# Patient Record
Sex: Female | Born: 1985 | Race: Black or African American | Hispanic: No | Marital: Married | Smoking: Never smoker
Health system: Southern US, Community
[De-identification: ages and names within clinical notes are randomized; demographics above are authoritative.]

## PROBLEM LIST (undated history)

## (undated) DIAGNOSIS — D573 Sickle-cell trait: Secondary | ICD-10-CM

## (undated) DIAGNOSIS — O24919 Unspecified diabetes mellitus in pregnancy, unspecified trimester: Secondary | ICD-10-CM

---

## 2013-10-31 ENCOUNTER — Encounter (HOSPITAL_COMMUNITY): Payer: Self-pay | Admitting: Emergency Medicine

## 2013-10-31 ENCOUNTER — Emergency Department (INDEPENDENT_AMBULATORY_CARE_PROVIDER_SITE_OTHER)
Admission: EM | Admit: 2013-10-31 | Discharge: 2013-10-31 | Disposition: A | Discharge status: Home / Self Care | Payer: PRIVATE HEALTH INSURANCE | Source: Home / Self Care | Attending: Family Medicine | Admitting: Family Medicine

## 2013-10-31 DIAGNOSIS — H8110 Benign paroxysmal vertigo, unspecified ear: Secondary | ICD-10-CM

## 2013-10-31 MED ORDER — FLUTICASONE PROPIONATE 50 MCG/ACT NA SUSP: 1.0000 | Freq: Two times a day (BID) | NASAL | Status: DC

## 2013-10-31 MED ORDER — MECLIZINE HCL 50 MG PO TABS: 50.0000 mg | ORAL_TABLET | Freq: Three times a day (TID) | ORAL | Status: DC | PRN

## 2013-10-31 NOTE — ED Provider Notes (Signed)
CSN: [id number]     Arrival date & time 10/31/13  1958 History   First MD Initiated Contact with Patient 10/31/13 2018     Chief Complaint  Patient presents with  . Dizziness   (Consider location/radiation/quality/duration/timing/severity/associated sxs/prior Treatment) Patient is a 28 y.o. female presenting with dizziness. The history is provided by the patient.  Dizziness Quality:  Room spinning Severity:  Mild Onset quality:  Gradual Duration:  5 days Timing:  Intermittent Progression:  Waxing and waning Chronicity:  New Context: head movement   Relieved by:  Change in position Worsened by:  Nothing tried Ineffective treatments:  None tried Associated symptoms: no headaches, no nausea, no palpitations, no tinnitus, no vision changes, no vomiting and no weakness     History reviewed. No pertinent past medical history. Past Surgical History  Procedure Laterality Date  . Cesarean section     No family history on file. History  Substance Use Topics  . Smoking status: Never Smoker   . Smokeless tobacco: Not on file  . Alcohol Use: No   OB History   Grav Para Term Preterm Abortions TAB SAB Ect Mult Living                 Review of Systems  Constitutional: Negative.   HENT: Positive for congestion, postnasal drip and rhinorrhea. Negative for tinnitus.   Cardiovascular: Negative for palpitations.  Gastrointestinal: Negative for nausea and vomiting.  Neurological: Positive for dizziness. Negative for facial asymmetry, weakness, numbness and headaches.    Allergies  Review of patient's allergies indicates no known allergies.  Home Medications   Current Outpatient Rx  Name  Route  Sig  Dispense  Refill  . fluticasone (FLONASE) 50 MCG/ACT nasal spray   Each Nare   Place 1 spray into both nostrils 2 (two) times daily.   1 g   2   . meclizine (ANTIVERT) 50 MG tablet   Oral   Take 1 tablet (50 mg total) by mouth 3 (three) times daily as needed.   30 tablet   0     BP 126/82  Pulse 82  Temp(Src) 98.9 F (37.2 C) (Oral)  Resp 18  SpO2 100%  LMP 10/18/2013 Physical Exam  Constitutional: She is oriented to person, place, and time. She appears well-developed and well-nourished.  HENT:  Right Ear: External ear normal.  Left Ear: External ear normal.  Nose: Mucosal edema and rhinorrhea present.  Mouth/Throat: Oropharynx is clear and moist.  Eyes: EOM are normal. Pupils are equal, round, and reactive to light.  Neck: Normal range of motion. Neck supple.  Cardiovascular: Normal heart sounds.   Lymphadenopathy:    She has no cervical adenopathy.  Neurological: She is alert and oriented to person, place, and time. She has normal strength. No cranial nerve deficit or sensory deficit. She displays a negative Romberg sign. Coordination normal.  Skin: Skin is warm and dry.    ED Course  Procedures (including critical care time) Labs Review Labs Reviewed - No data to display Imaging Review No results found.   MDM   1. BPV (benign positional vertigo)       Linna HoffJames D Jaquell Seddon, MD 10/31/13 2046

## 2013-10-31 NOTE — Discharge Instructions (Signed)
Drink lots of water, use medicine as prescribed, return if needed.

## 2013-10-31 NOTE — ED Notes (Signed)
Reports dizziness for 5 days, dizziness is intermittent.  Patient denies pain.  Denies nausea, denies vomiting, denies diarrhea.

## 2014-05-09 ENCOUNTER — Ambulatory Visit (INDEPENDENT_AMBULATORY_CARE_PROVIDER_SITE_OTHER): Payer: PRIVATE HEALTH INSURANCE | Admitting: Obstetrics & Gynecology

## 2014-05-09 ENCOUNTER — Encounter: Payer: Self-pay | Admitting: Obstetrics & Gynecology

## 2014-05-09 ENCOUNTER — Other Ambulatory Visit: Payer: Self-pay | Admitting: Obstetrics & Gynecology

## 2014-05-09 VITALS — BP 107/76 | HR 87 | Ht 62.0 in | Wt 200.0 lb

## 2014-05-09 DIAGNOSIS — Z118 Encounter for screening for other infectious and parasitic diseases: Secondary | ICD-10-CM

## 2014-05-09 DIAGNOSIS — Z23 Encounter for immunization: Secondary | ICD-10-CM

## 2014-05-09 DIAGNOSIS — Z124 Encounter for screening for malignant neoplasm of cervix: Secondary | ICD-10-CM

## 2014-05-09 DIAGNOSIS — O34219 Maternal care for unspecified type scar from previous cesarean delivery: Secondary | ICD-10-CM | POA: Insufficient documentation

## 2014-05-09 DIAGNOSIS — Z3482 Encounter for supervision of other normal pregnancy, second trimester: Secondary | ICD-10-CM

## 2014-05-09 DIAGNOSIS — Z113 Encounter for screening for infections with a predominantly sexual mode of transmission: Secondary | ICD-10-CM

## 2014-05-09 DIAGNOSIS — O3421 Maternal care for scar from previous cesarean delivery: Secondary | ICD-10-CM

## 2014-05-09 DIAGNOSIS — Z1151 Encounter for screening for human papillomavirus (HPV): Secondary | ICD-10-CM

## 2014-05-09 DIAGNOSIS — Z98891 History of uterine scar from previous surgery: Secondary | ICD-10-CM | POA: Insufficient documentation

## 2014-05-09 MED ORDER — METRONIDAZOLE 500 MG PO TABS: 500.0000 mg | ORAL_TABLET | Freq: Two times a day (BID) | ORAL | Status: DC

## 2014-05-09 NOTE — Progress Notes (Signed)
   Subjective:    Brittany Livingston is a G2P1001 [id number] being seen today for her first obstetrical visit.  Her obstetrical history is significant for obesity and h/o C/S in [place] for "fetal distress". Patient does intend to breast feed. Pregnancy history fully reviewed.  Patient reports vaginal irritation.  Filed Vitals:   05/09/14 0935 05/09/14 0937  BP: 107/76   Pulse: 87   Height:  5\' 2"  (1.575 m)  Weight: 200 lb (90.719 kg)     HISTORY: OB History  Gravida Para Term Preterm AB SAB TAB Ectopic Multiple Living  2 1 1  0 0 0 0 0 0 1    # Outcome Date GA Lbr Len/2nd Weight Sex Delivery Anes PTL Lv  2 CUR           1 TRM 04/27/13   7 lb 4.4 oz (3.3 kg) M LTCS Spinal N Y     History reviewed. No pertinent past medical history. Past Surgical History  Procedure Laterality Date  . Cesarean section     History reviewed. No pertinent family history.   Exam    Uterus:     Pelvic Exam:    Perineum: No Hemorrhoids   Vulva: normal   Vagina:  Discharge c/w BV   pH:    Cervix: anteverted   Adnexa: normal adnexa   Bony Pelvis: android  System: Breast:  normal appearance, no masses or tenderness   Skin: normal coloration and turgor, no rashes    Neurologic: oriented   Extremities: normal strength, tone, and muscle mass   HEENT PERRLA   Mouth/Teeth mucous membranes moist, pharynx normal without lesions   Neck supple   Cardiovascular: regular rate and rhythm   Respiratory:  appears well, vitals normal, no respiratory distress, acyanotic, normal RR, ear and throat exam is normal, neck free of mass or lymphadenopathy, chest clear, no wheezing, crepitations, rhonchi, normal symmetric air entry   Abdomen: soft, non-tender; bowel sounds normal; no masses,  no organomegaly   Urinary: urethral meatus normal      Assessment:    Pregnancy: G2P1001 Patient Active Problem List   Diagnosis Date Noted  . Encounter for supervision of other normal pregnancy in second trimester 05/09/2014         Plan:     Initial labs drawn. Prenatal vitamins. Problem list reviewed and updated. Genetic Screening discussed Quad Screen: undecided.  Ultrasound discussed; fetal survey: requested.  Follow up in 4 weeks. I will treat her BV signs and symptoms with flagyl. She will get an early glucola at her next visit when she will need to decide about the Quad screen.  Karthikeya Funke C. 05/09/2014

## 2014-05-09 NOTE — Progress Notes (Signed)
New OB is having increased discharge.  Bedside scan shows HC and Femur length to be 14 wk 5 days and 14 weeks 3 days with is consistent with LMP dating within limits.

## 2014-05-10 LAB — OBSTETRIC PANEL
Antibody Screen: NEGATIVE
Basophils Absolute: 0 10*3/uL (ref 0.0–0.1)
Basophils Relative: 0 % (ref 0–1)
EOS ABS: 0.1 10*3/uL (ref 0.0–0.7)
Eosinophils Relative: 1 % (ref 0–5)
HCT: 39.1 % (ref 36.0–46.0)
Hemoglobin: 12.7 g/dL (ref 12.0–15.0)
Hepatitis B Surface Ag: NEGATIVE
LYMPHS ABS: 2.1 10*3/uL (ref 0.7–4.0)
LYMPHS PCT: 31 % (ref 12–46)
MCH: 25.3 pg — AB (ref 26.0–34.0)
MCHC: 32.5 g/dL (ref 30.0–36.0)
MCV: 77.9 fL — AB (ref 78.0–100.0)
MONO ABS: 0.4 10*3/uL (ref 0.1–1.0)
Monocytes Relative: 6 % (ref 3–12)
Neutro Abs: 4.3 10*3/uL (ref 1.7–7.7)
Neutrophils Relative %: 62 % (ref 43–77)
PLATELETS: 243 10*3/uL (ref 150–400)
RBC: 5.02 MIL/uL (ref 3.87–5.11)
RDW: 15.8 % — ABNORMAL HIGH (ref 11.5–15.5)
Rh Type: POSITIVE
Rubella: 28.4 Index — ABNORMAL HIGH (ref <0.90)
WBC: 6.9 10*3/uL (ref 4.0–10.5)

## 2014-05-10 LAB — HIV ANTIBODY (ROUTINE TESTING W REFLEX): HIV 1&2 Ab, 4th Generation: NONREACTIVE

## 2014-05-10 LAB — SICKLE CELL SCREEN: Sickle Cell Screen: POSITIVE — AB

## 2014-05-11 LAB — CULTURE, OB URINE
COLONY COUNT: NO GROWTH
Organism ID, Bacteria: NO GROWTH

## 2014-05-12 LAB — CYTOLOGY - PAP

## 2014-05-13 ENCOUNTER — Telehealth: Payer: Self-pay | Admitting: *Deleted

## 2014-05-13 DIAGNOSIS — Z3481 Encounter for supervision of other normal pregnancy, first trimester: Secondary | ICD-10-CM

## 2014-05-13 NOTE — Telephone Encounter (Signed)
Contacted [facility] to add hgm electrophoresis testing to patients positive sickle cell screen.

## 2014-05-13 NOTE — Telephone Encounter (Signed)
Message copied by Barbara CowerNOGUES, Harper Vandervoort L on Tue May 13, 2014 11:21 AM ------      Message from: Allie BossierVE, MYRA C      Created: Mon May 12, 2014  4:22 PM       She had a+ sickle cell screen. Can you please run the confirmatory electrophoresis?      Thanks ------

## 2014-05-15 LAB — HEMOGLOBINOPATHY EVALUATION
HEMOGLOBIN OTHER: 0 %
HGB S QUANTITAION: 35 % — AB
Hgb A2 Quant: 3.4 % — ABNORMAL HIGH (ref 2.2–3.2)
Hgb A: 61 % — ABNORMAL LOW (ref 96.8–97.8)
Hgb F Quant: 0.6 % (ref 0.0–2.0)

## 2014-06-02 ENCOUNTER — Encounter: Payer: Self-pay | Admitting: Obstetrics & Gynecology

## 2014-06-06 ENCOUNTER — Ambulatory Visit (INDEPENDENT_AMBULATORY_CARE_PROVIDER_SITE_OTHER): Payer: PRIVATE HEALTH INSURANCE | Admitting: Obstetrics & Gynecology

## 2014-06-06 VITALS — BP 117/74 | HR 83 | Wt 203.0 lb

## 2014-06-06 DIAGNOSIS — N898 Other specified noninflammatory disorders of vagina: Secondary | ICD-10-CM

## 2014-06-06 DIAGNOSIS — O26892 Other specified pregnancy related conditions, second trimester: Secondary | ICD-10-CM

## 2014-06-06 DIAGNOSIS — Z3482 Encounter for supervision of other normal pregnancy, second trimester: Secondary | ICD-10-CM

## 2014-06-06 NOTE — Progress Notes (Signed)
Routine visit. Good FM. She complains of a month of burning sensation that occurs in her vagina after sex. She and her husband are not using any creams, gels, etc. She took her flagyl prescribed at her last visit for BV. She denies vaginal dryness. She denies any pregnancy-related problems. She declines Quad screen. She agrees to a MSAFP. I will schedule her anatomy u/s for 20 weeks. With speculum exam her vulva appears normal. Her vagina has relatively large amount of white discharge. I sent a wet prep.

## 2014-06-06 NOTE — Progress Notes (Signed)
C/o Burning sensation during intercourse only.

## 2014-06-07 LAB — WET PREP, GENITAL
Clue Cells Wet Prep HPF POC: NONE SEEN
Trich, Wet Prep: NONE SEEN
WBC, Wet Prep HPF POC: NONE SEEN
Yeast Wet Prep HPF POC: NONE SEEN

## 2014-06-10 ENCOUNTER — Other Ambulatory Visit (INDEPENDENT_AMBULATORY_CARE_PROVIDER_SITE_OTHER): Payer: PRIVATE HEALTH INSURANCE | Admitting: *Deleted

## 2014-06-10 DIAGNOSIS — O99212 Obesity complicating pregnancy, second trimester: Secondary | ICD-10-CM

## 2014-06-10 DIAGNOSIS — E669 Obesity, unspecified: Secondary | ICD-10-CM

## 2014-06-10 NOTE — Progress Notes (Signed)
Pt here today for a early 1 hr GTT.

## 2014-06-11 LAB — ALPHA FETOPROTEIN, MATERNAL
AFP: 31.9 ng/mL
CURR GEST AGE: 18 wks.days
MoM for AFP: 0.78
OPEN SPINA BIFIDA: NEGATIVE

## 2014-06-11 LAB — GLUCOSE TOLERANCE, 1 HOUR (50G) W/O FASTING: GLUCOSE 1 HOUR GTT: 103 mg/dL (ref 70–140)

## 2014-06-20 ENCOUNTER — Ambulatory Visit (HOSPITAL_COMMUNITY)
Admission: RE | Admit: 2014-06-20 | Discharge: 2014-06-20 | Disposition: A | Discharge status: Home / Self Care | Payer: PRIVATE HEALTH INSURANCE | Source: Ambulatory Visit | Attending: Obstetrics & Gynecology | Admitting: Obstetrics & Gynecology

## 2014-06-20 DIAGNOSIS — Z3A20 20 weeks gestation of pregnancy: Secondary | ICD-10-CM | POA: Insufficient documentation

## 2014-06-20 DIAGNOSIS — Z3482 Encounter for supervision of other normal pregnancy, second trimester: Secondary | ICD-10-CM

## 2014-06-20 DIAGNOSIS — Z36 Encounter for antenatal screening of mother: Secondary | ICD-10-CM | POA: Insufficient documentation

## 2014-06-20 DIAGNOSIS — Z6837 Body mass index (BMI) 37.0-37.9, adult: Secondary | ICD-10-CM | POA: Insufficient documentation

## 2014-06-20 DIAGNOSIS — O3421 Maternal care for scar from previous cesarean delivery: Secondary | ICD-10-CM | POA: Insufficient documentation

## 2014-06-20 DIAGNOSIS — O99212 Obesity complicating pregnancy, second trimester: Secondary | ICD-10-CM | POA: Insufficient documentation

## 2014-06-20 DIAGNOSIS — Z3689 Encounter for other specified antenatal screening: Secondary | ICD-10-CM | POA: Insufficient documentation

## 2014-06-20 DIAGNOSIS — O99210 Obesity complicating pregnancy, unspecified trimester: Secondary | ICD-10-CM | POA: Insufficient documentation

## 2014-07-04 ENCOUNTER — Ambulatory Visit (INDEPENDENT_AMBULATORY_CARE_PROVIDER_SITE_OTHER): Payer: PRIVATE HEALTH INSURANCE | Admitting: Family Medicine

## 2014-07-04 VITALS — BP 110/75 | HR 90 | Wt 207.0 lb

## 2014-07-04 DIAGNOSIS — Z3482 Encounter for supervision of other normal pregnancy, second trimester: Secondary | ICD-10-CM

## 2014-07-04 DIAGNOSIS — O3421 Maternal care for scar from previous cesarean delivery: Secondary | ICD-10-CM

## 2014-07-04 NOTE — Patient Instructions (Addendum)
Second Trimester of Pregnancy The second trimester is from week 13 through week 28, months 4 through 6. The second trimester is often a time when you feel your best. Your body has also adjusted to being pregnant, and you begin to feel better physically. Usually, morning sickness has lessened or quit completely, you may have more energy, and you may have an increase in appetite. The second trimester is also a time when the fetus is growing rapidly. At the end of the sixth month, the fetus is about 9 inches long and weighs about 1 pounds. You will likely begin to feel the baby move (quickening) between 18 and 20 weeks of the pregnancy. BODY CHANGES Your body goes through many changes during pregnancy. The changes vary from woman to woman.   Your weight will continue to increase. You will notice your lower abdomen bulging out.  You may begin to get stretch marks on your hips, abdomen, and breasts.  You may develop headaches that can be relieved by medicines approved by your health care provider.  You may urinate more often because the fetus is pressing on your bladder.  You may develop or continue to have heartburn as a result of your pregnancy.  You may develop constipation because certain hormones are causing the muscles that push waste through your intestines to slow down.  You may develop hemorrhoids or swollen, bulging veins (varicose veins).  You may have back pain because of the weight gain and pregnancy hormones relaxing your joints between the bones in your pelvis and as a result of a shift in weight and the muscles that support your balance.  Your breasts will continue to grow and be tender.  Your gums may bleed and may be sensitive to brushing and flossing.  Dark spots or blotches (chloasma, mask of pregnancy) may develop on your face. This will likely fade after the baby is born.  A dark line from your belly button to the pubic area (linea nigra) may appear. This will likely  fade after the baby is born.  You may have changes in your hair. These can include thickening of your hair, rapid growth, and changes in texture. Some women also have hair loss during or after pregnancy, or hair that feels dry or thin. Your hair will most likely return to normal after your baby is born. WHAT TO EXPECT AT YOUR PRENATAL VISITS During a routine prenatal visit:  You will be weighed to make sure you and the fetus are growing normally.  Your blood pressure will be taken.  Your abdomen will be measured to track your baby's growth.  The fetal heartbeat will be listened to.  Any test results from the previous visit will be discussed. Your health care provider may ask you:  How you are feeling.  If you are feeling the baby move.  If you have had any abnormal symptoms, such as leaking fluid, bleeding, severe headaches, or abdominal cramping.  If you have any questions. Other tests that may be performed during your second trimester include:  Blood tests that check for:  Low iron levels (anemia).  Gestational diabetes (between 24 and 28 weeks).  Rh antibodies.  Urine tests to check for infections, diabetes, or protein in the urine.  An ultrasound to confirm the proper growth and development of the baby.  An amniocentesis to check for possible genetic problems.  Fetal screens for spina bifida and Down syndrome. HOME CARE INSTRUCTIONS   Avoid all smoking, herbs, alcohol, and unprescribed   drugs. These chemicals affect the formation and growth of the baby.  Follow your health care provider's instructions regarding medicine use. There are medicines that are either safe or unsafe to take during pregnancy.  Exercise only as directed by your health care provider. Experiencing uterine cramps is a good sign to stop exercising.  Continue to eat regular, healthy meals.  Wear a good support bra for breast tenderness.  Do not use hot tubs, steam rooms, or saunas.  Wear  your seat belt at all times when driving.  Avoid raw meat, uncooked cheese, cat litter boxes, and soil used by cats. These carry germs that can cause birth defects in the baby.  Take your prenatal vitamins.  Try taking a stool softener (if your health care provider approves) if you develop constipation. Eat more high-fiber foods, such as fresh vegetables or fruit and whole grains. Drink plenty of fluids to keep your urine clear or pale yellow.  Take warm sitz baths to soothe any pain or discomfort caused by hemorrhoids. Use hemorrhoid cream if your health care provider approves.  If you develop varicose veins, wear support hose. Elevate your feet for 15 minutes, 3-4 times a day. Limit salt in your diet.  Avoid heavy lifting, wear low heel shoes, and practice good posture.  Rest with your legs elevated if you have leg cramps or low back pain.  Visit your dentist if you have not gone yet during your pregnancy. Use a soft toothbrush to brush your teeth and be gentle when you floss.  A sexual relationship may be continued unless your health care provider directs you otherwise.  Continue to go to all your prenatal visits as directed by your health care provider. SEEK MEDICAL CARE IF:   You have dizziness.  You have mild pelvic cramps, pelvic pressure, or nagging pain in the abdominal area.  You have persistent nausea, vomiting, or diarrhea.  You have a bad smelling vaginal discharge.  You have pain with urination. SEEK IMMEDIATE MEDICAL CARE IF:   You have a fever.  You are leaking fluid from your vagina.  You have spotting or bleeding from your vagina.  You have severe abdominal cramping or pain.  You have rapid weight gain or loss.  You have shortness of breath with chest pain.  You notice sudden or extreme swelling of your face, hands, ankles, feet, or legs.  You have not felt your baby move in over an hour.  You have severe headaches that do not go away with  medicine.  You have vision changes. Document Released: 07/12/2001 Document Revised: 07/23/2013 Document Reviewed: 09/18/2012 [employer] Patient Information 2015 [employer]. This information is not intended to replace advice given to you by your health care provider. Make sure you discuss any questions you have with your health care provider.  Breastfeeding Deciding to breastfeed is one of the best choices you can make for you and your baby. A change in hormones during pregnancy causes your breast tissue to grow and increases the number and size of your milk ducts. These hormones also allow proteins, sugars, and fats from your blood supply to make breast milk in your milk-producing glands. Hormones prevent breast milk from being released before your baby is born as well as prompt milk flow after birth. Once breastfeeding has begun, thoughts of your baby, as well as his or her sucking or crying, can stimulate the release of milk from your milk-producing glands.  BENEFITS OF BREASTFEEDING For Your Baby  Your first   milk (colostrum) helps your baby's digestive system function better.   There are antibodies in your milk that help your baby fight off infections.   Your baby has a lower incidence of asthma, allergies, and sudden infant death syndrome.   The nutrients in breast milk are better for your baby than infant formulas and are designed uniquely for your baby's needs.   Breast milk improves your baby's brain development.   Your baby is less likely to develop other conditions, such as childhood obesity, asthma, or type 2 diabetes mellitus.  For You   Breastfeeding helps to create a very special bond between you and your baby.   Breastfeeding is convenient. Breast milk is always available at the correct temperature and costs nothing.   Breastfeeding helps to burn calories and helps you lose the weight gained during pregnancy.   Breastfeeding makes your uterus contract to its  prepregnancy size faster and slows bleeding (lochia) after you give birth.   Breastfeeding helps to lower your risk of developing type 2 diabetes mellitus, osteoporosis, and breast or ovarian cancer later in life. SIGNS THAT YOUR BABY IS HUNGRY Early Signs of Hunger  Increased alertness or activity.  Stretching.  Movement of the head from side to side.  Movement of the head and opening of the mouth when the corner of the mouth or cheek is stroked (rooting).  Increased sucking sounds, smacking lips, cooing, sighing, or squeaking.  Hand-to-mouth movements.  Increased sucking of fingers or hands. Late Signs of Hunger  Fussing.  Intermittent crying. Extreme Signs of Hunger Signs of extreme hunger will require calming and consoling before your baby will be able to breastfeed successfully. Do not wait for the following signs of extreme hunger to occur before you initiate breastfeeding:   Restlessness.  A loud, strong cry.   Screaming. BREASTFEEDING BASICS Breastfeeding Initiation  Find a comfortable place to sit or lie down, with your neck and back well supported.  Place a pillow or rolled up blanket under your baby to bring him or her to the level of your breast (if you are seated). Nursing pillows are specially designed to help support your arms and your baby while you breastfeed.  Make sure that your baby's abdomen is facing your abdomen.   Gently massage your breast. With your fingertips, massage from your chest wall toward your nipple in a circular motion. This encourages milk flow. You may need to continue this action during the feeding if your milk flows slowly.  Support your breast with 4 fingers underneath and your thumb above your nipple. Make sure your fingers are well away from your nipple and your baby's mouth.   Stroke your baby's lips gently with your finger or nipple.   When your baby's mouth is open wide enough, quickly bring your baby to your breast,  placing your entire nipple and as much of the colored area around your nipple (areola) as possible into your baby's mouth.   More areola should be visible above your baby's upper lip than below the lower lip.   Your baby's tongue should be between his or her lower gum and your breast.   Ensure that your baby's mouth is correctly positioned around your nipple (latched). Your baby's lips should create a seal on your breast and be turned out (everted).  It is common for your baby to suck about 2-3 minutes in order to start the flow of breast milk. Latching Teaching your baby how to latch on to your breast   properly is very important. An improper latch can cause nipple pain and decreased milk supply for you and poor weight gain in your baby. Also, if your baby is not latched onto your nipple properly, he or she may swallow some air during feeding. This can make your baby fussy. Burping your baby when you switch breasts during the feeding can help to get rid of the air. However, teaching your baby to latch on properly is still the best way to prevent fussiness from swallowing air while breastfeeding. Signs that your baby has successfully latched on to your nipple:    Silent tugging or silent sucking, without causing you pain.   Swallowing heard between every 3-4 sucks.    Muscle movement above and in front of his or her ears while sucking.  Signs that your baby has not successfully latched on to nipple:   Sucking sounds or smacking sounds from your baby while breastfeeding.  Nipple pain. If you think your baby has not latched on correctly, slip your finger into the corner of your baby's mouth to break the suction and place it between your baby's gums. Attempt breastfeeding initiation again. Signs of Successful Breastfeeding Signs from your baby:   A gradual decrease in the number of sucks or complete cessation of sucking.   Falling asleep.   Relaxation of his or her body.    Retention of a small amount of milk in his or her mouth.   Letting go of your breast by himself or herself. Signs from you:  Breasts that have increased in firmness, weight, and size 1-3 hours after feeding.   Breasts that are softer immediately after breastfeeding.  Increased milk volume, as well as a change in milk consistency and color by the fifth day of breastfeeding.   Nipples that are not sore, cracked, or bleeding. Signs That Your Baby is Getting Enough Milk  Wetting at least 3 diapers in a 24-hour period. The urine should be clear and pale yellow by age 5 days.  At least 3 stools in a 24-hour period by age 5 days. The stool should be soft and yellow.  At least 3 stools in a 24-hour period by age 7 days. The stool should be seedy and yellow.  No loss of weight greater than 10% of birth weight during the first 3 days of age.  Average weight gain of 4-7 ounces (113-198 g) per week after age 4 days.  Consistent daily weight gain by age 5 days, without weight loss after the age of 2 weeks. After a feeding, your baby may spit up a small amount. This is common. BREASTFEEDING FREQUENCY AND DURATION Frequent feeding will help you make more milk and can prevent sore nipples and breast engorgement. Breastfeed when you feel the need to reduce the fullness of your breasts or when your baby shows signs of hunger. This is called "breastfeeding on demand." Avoid introducing a pacifier to your baby while you are working to establish breastfeeding (the first 4-6 weeks after your baby is born). After this time you may choose to use a pacifier. Research has shown that pacifier use during the first year of a baby's life decreases the risk of sudden infant death syndrome (SIDS). Allow your baby to feed on each breast as long as he or she wants. Breastfeed until your baby is finished feeding. When your baby unlatches or falls asleep while feeding from the first breast, offer the second breast.  Because newborns are often sleepy in the   first few weeks of life, you may need to awaken your baby to get him or her to feed. Breastfeeding times will vary from baby to baby. However, the following rules can serve as a guide to help you ensure that your baby is properly fed:  Newborns (babies 4 weeks of age or younger) may breastfeed every 1-3 hours.  Newborns should not go longer than 3 hours during the day or 5 hours during the night without breastfeeding.  You should breastfeed your baby a minimum of 8 times in a 24-hour period until you begin to introduce solid foods to your baby at around 6 months of age. BREAST MILK PUMPING Pumping and storing breast milk allows you to ensure that your baby is exclusively fed your breast milk, even at times when you are unable to breastfeed. This is especially important if you are going back to work while you are still breastfeeding or when you are not able to be present during feedings. Your lactation consultant can give you guidelines on how long it is safe to store breast milk.  A breast pump is a machine that allows you to pump milk from your breast into a sterile bottle. The pumped breast milk can then be stored in a refrigerator or freezer. Some breast pumps are operated by hand, while others use electricity. Ask your lactation consultant which type will work best for you. Breast pumps can be purchased, but some hospitals and breastfeeding support groups lease breast pumps on a monthly basis. A lactation consultant can teach you how to hand express breast milk, if you prefer not to use a pump.  CARING FOR YOUR BREASTS WHILE YOU BREASTFEED Nipples can become dry, cracked, and sore while breastfeeding. The following recommendations can help keep your breasts moisturized and healthy:  Avoid using soap on your nipples.   Wear a supportive bra. Although not required, special nursing bras and tank tops are designed to allow access to your breasts for  breastfeeding without taking off your entire bra or top. Avoid wearing underwire-style bras or extremely tight bras.  Air dry your nipples for 3-4minutes after each feeding.   Use only cotton bra pads to absorb leaked breast milk. Leaking of breast milk between feedings is normal.   Use lanolin on your nipples after breastfeeding. Lanolin helps to maintain your skin's normal moisture barrier. If you use pure lanolin, you do not need to wash it off before feeding your baby again. Pure lanolin is not toxic to your baby. You may also hand express a few drops of breast milk and gently massage that milk into your nipples and allow the milk to air dry. In the first few weeks after giving birth, some women experience extremely full breasts (engorgement). Engorgement can make your breasts feel heavy, warm, and tender to the touch. Engorgement peaks within 3-5 days after you give birth. The following recommendations can help ease engorgement:  Completely empty your breasts while breastfeeding or pumping. You may want to start by applying warm, moist heat (in the shower or with warm water-soaked hand towels) just before feeding or pumping. This increases circulation and helps the milk flow. If your baby does not completely empty your breasts while breastfeeding, pump any extra milk after he or she is finished.  Wear a snug bra (nursing or regular) or tank top for 1-2 days to signal your body to slightly decrease milk production.  Apply ice packs to your breasts, unless this is too uncomfortable for you.    Make sure that your baby is latched on and positioned properly while breastfeeding. If engorgement persists after 48 hours of following these recommendations, contact your health care provider or a lactation consultant. OVERALL HEALTH CARE RECOMMENDATIONS WHILE BREASTFEEDING  Eat healthy foods. Alternate between meals and snacks, eating 3 of each per day. Because what you eat affects your breast milk,  some of the foods may make your baby more irritable than usual. Avoid eating these foods if you are sure that they are negatively affecting your baby.  Drink milk, fruit juice, and water to satisfy your thirst (about 10 glasses a day).   Rest often, relax, and continue to take your prenatal vitamins to prevent fatigue, stress, and anemia.  Continue breast self-awareness checks.  Avoid chewing and smoking tobacco.  Avoid alcohol and drug use. Some medicines that may be harmful to your baby can pass through breast milk. It is important to ask your health care provider before taking any medicine, including all over-the-counter and prescription medicine as well as vitamin and herbal supplements. It is possible to become pregnant while breastfeeding. If birth control is desired, ask your health care provider about options that will be safe for your baby. SEEK MEDICAL CARE IF:   You feel like you want to stop breastfeeding or have become frustrated with breastfeeding.  You have painful breasts or nipples.  Your nipples are cracked or bleeding.  Your breasts are red, tender, or warm.  You have a swollen area on either breast.  You have a fever or chills.  You have nausea or vomiting.  You have drainage other than breast milk from your nipples.  Your breasts do not become full before feedings by the fifth day after you give birth.  You feel sad and depressed.  Your baby is too sleepy to eat well.  Your baby is having trouble sleeping.   Your baby is wetting less than 3 diapers in a 24-hour period.  Your baby has less than 3 stools in a 24-hour period.  Your baby's skin or the white part of his or her eyes becomes yellow.   Your baby is not gaining weight by 5 days of age. SEEK IMMEDIATE MEDICAL CARE IF:   Your baby is overly tired (lethargic) and does not want to wake up and feed.  Your baby develops an unexplained fever. Document Released: 07/18/2005 Document Revised:  07/23/2013 Document Reviewed: 01/09/2013 [employer] Patient Information 2015 [employer]. This information is not intended to replace advice given to you by your health care provider. Make sure you discuss any questions you have with your health care provider.  Vaginal Birth After Cesarean Delivery Vaginal birth after cesarean delivery (VBAC) is giving birth vaginally after previously delivering a baby by a cesarean. In the past, if a woman had a cesarean delivery, all births afterward would be done by cesarean delivery. This is no longer true. It can be safe for the mother to try a vaginal delivery after having a cesarean delivery.  It is important to discuss VBAC with your health care provider early in the pregnancy so you can understand the risks, benefits, and options. It will give you time to decide what is best in your particular case. The final decision about whether to have a VBAC or repeat cesarean delivery should be between you and your health care provider. Any changes in your health or your baby's health during your pregnancy may make it necessary to change your initial decision about VBAC.  WOMEN   WHO PLAN TO HAVE A VBAC SHOULD CHECK WITH THEIR HEALTH CARE PROVIDER TO BE SURE THAT:  The previous cesarean delivery was done with a low transverse uterine cut (incision) (not a vertical classical incision).   The birth canal is big enough for the baby.   There were no other operations on the uterus.   An electronic fetal monitor (EFM) will be on at all times during labor.   An operating room will be available and ready in case an emergency cesarean delivery is needed.   A health care provider and surgical nursing staff will be available at all times during labor to be ready to do an emergency delivery cesarean if necessary.   An anesthesiologist will be present in case an emergency cesarean delivery is needed.   The nursery is prepared and has adequate personnel and necessary  equipment available to care for the baby in case of an emergency cesarean delivery. BENEFITS OF VBAC  Shorter stay in the hospital.   Avoidance of risks associated with cesarean delivery, such as:  Surgical complications, such as opening of the incision or hernia in the incision.  Injury to other organs.  Fever. This can occur if an infection develops after surgery. It can also occur as a reaction to the medicine given to make you numb during the surgery.  Less blood loss and need for blood transfusions.  Lower risk of blood clots and infection.  Shorter recovery.   Decreased risk for having to remove the uterus (hysterectomy).   Decreased risk for the placenta to completely or partially cover the opening of the uterus (placenta previa) with a future pregnancy.   Decrease risk in future labor and delivery. RISKS OF A VBAC  Tearing (rupture) of the uterus. This is occurs in less than 1% of VBACs. The risk of this happening is higher if:  Steps are taken to begin the labor process (induce labor) or stimulate or strengthen contractions (augment labor).   Medicine is used to soften (ripen) the cervix.  Having to remove the uterus (hysterectomy) if it ruptures. VBAC SHOULD NOT BE DONE IF:  The previous cesarean delivery was done with a vertical (classical) or T-shaped incision or you do not know what kind of incision was made.   You had a ruptured uterus.   You have had certain types of surgery on your uterus, such as removal of uterine fibroids. Ask your health care provider about other types of surgeries that prevent you from having a VBAC.  You have certain medical or childbirth (obstetrical) problems.   There are problems with the baby.   You have had two previous cesarean deliveries and no vaginal deliveries. OTHER FACTS TO KNOW ABOUT VBAC:  It is safe to have an epidural anesthetic with VBAC.   It is safe to turn the baby from a breech position (attempt  an external cephalic version).   It is safe to try a VBAC with twins.   VBAC may not be successful if your baby weights 8.8 lb (4 kg) or more. However, weight predictions are not always accurate and should not be used alone to decide if VBAC is right for you.  There is an increased failure rate if the time between the cesarean delivery and VBAC is less than 19 months.   Your health care provider may advise against a VBAC if you have preeclampsia (high blood pressure, protein in the urine, and swelling of face and extremities).   VBAC is often successful   if you previously gave birth vaginally.   VBAC is often successful when the labor starts spontaneously before the due date.   Delivering a baby through a VBAC is similar to having a normal spontaneous vaginal delivery. Document Released: 01/08/2007 Document Revised: 12/02/2013 Document Reviewed: 02/14/2013 [employer] Patient Information 2015 [employer]. This information is not intended to replace advice given to you by your health care provider. Make sure you discuss any questions you have with your health care provider.  

## 2014-07-07 NOTE — Progress Notes (Signed)
Doing well Routine precautions

## 2014-08-01 DIAGNOSIS — O24919 Unspecified diabetes mellitus in pregnancy, unspecified trimester: Secondary | ICD-10-CM

## 2014-08-01 HISTORY — DX: Unspecified diabetes mellitus in pregnancy, unspecified trimester: O24.919

## 2014-08-01 NOTE — L&D Delivery Note (Signed)
Elective repeat c/section regional anesthesia.  No complications of delivery, Apgars 7/9, routine suctioning warming drying. EXAM GEN: normal vigour, acrocyanosis HEENT: NCAT af soft CHEST: clear CV: nl heart tones, no murmur, pulses & cap refill WNL GU: nl female NEURO: tone, reflexes, strength WNL  Assess: normal newborn PLAN: routine skin-skin care

## 2014-08-12 ENCOUNTER — Ambulatory Visit (INDEPENDENT_AMBULATORY_CARE_PROVIDER_SITE_OTHER): Payer: PRIVATE HEALTH INSURANCE | Admitting: Family Medicine

## 2014-08-12 VITALS — BP 125/76 | HR 99 | Wt 212.0 lb

## 2014-08-12 DIAGNOSIS — Z3482 Encounter for supervision of other normal pregnancy, second trimester: Secondary | ICD-10-CM

## 2014-08-12 DIAGNOSIS — Z3493 Encounter for supervision of normal pregnancy, unspecified, third trimester: Secondary | ICD-10-CM

## 2014-08-12 DIAGNOSIS — Z23 Encounter for immunization: Secondary | ICD-10-CM

## 2014-08-12 NOTE — Progress Notes (Signed)
Leaning toward R[facility] Good FM. TDaP today-- 28 wk labs next week.

## 2014-08-12 NOTE — Patient Instructions (Signed)
Third Trimester of Pregnancy The third trimester is from week 29 through week 42, months 7 through 9. The third trimester is a time when the fetus is growing rapidly. At the end of the ninth month, the fetus is about 20 inches in length and weighs 6-10 pounds.  BODY CHANGES Your body goes through many changes during pregnancy. The changes vary from woman to woman.   Your weight will continue to increase. You can expect to gain 25-35 pounds (11-16 kg) by the end of the pregnancy.  You may begin to get stretch marks on your hips, abdomen, and breasts.  You may urinate more often because the fetus is moving lower into your pelvis and pressing on your bladder.  You may develop or continue to have heartburn as a result of your pregnancy.  You may develop constipation because certain hormones are causing the muscles that push waste through your intestines to slow down.  You may develop hemorrhoids or swollen, bulging veins (varicose veins).  You may have pelvic pain because of the weight gain and pregnancy hormones relaxing your joints between the bones in your pelvis. Backaches may result from overexertion of the muscles supporting your posture.  You may have changes in your hair. These can include thickening of your hair, rapid growth, and changes in texture. Some women also have hair loss during or after pregnancy, or hair that feels dry or thin. Your hair will most likely return to normal after your baby is born.  Your breasts will continue to grow and be tender. A yellow discharge may leak from your breasts called colostrum.  Your belly button may stick out.  You may feel short of breath because of your expanding uterus.  You may notice the fetus "dropping," or moving lower in your abdomen.  You may have a bloody mucus discharge. This usually occurs a few days to a week before labor begins.  Your cervix becomes thin and soft (effaced) near your due date. WHAT TO EXPECT AT YOUR  PRENATAL EXAMS  You will have prenatal exams every 2 weeks until week 36. Then, you will have weekly prenatal exams. During a routine prenatal visit:  You will be weighed to make sure you and the fetus are growing normally.  Your blood pressure is taken.  Your abdomen will be measured to track your baby's growth.  The fetal heartbeat will be listened to.  Any test results from the previous visit will be discussed.  You may have a cervical check near your due date to see if you have effaced. At around 36 weeks, your caregiver will check your cervix. At the same time, your caregiver will also perform a test on the secretions of the vaginal tissue. This test is to determine if a type of bacteria, Group B streptococcus, is present. Your caregiver will explain this further. Your caregiver may ask you:  What your birth plan is.  How you are feeling.  If you are feeling the baby move.  If you have had any abnormal symptoms, such as leaking fluid, bleeding, severe headaches, or abdominal cramping.  If you have any questions. Other tests or screenings that may be performed during your third trimester include:  Blood tests that check for low iron levels (anemia).  Fetal testing to check the health, activity level, and growth of the fetus. Testing is done if you have certain medical conditions or if there are problems during the pregnancy. FALSE LABOR You may feel small, irregular contractions that   eventually go away. These are called Braxton Hicks contractions, or false labor. Contractions may last for hours, days, or even weeks before true labor sets in. If contractions come at regular intervals, intensify, or become painful, it is best to be seen by your caregiver.  SIGNS OF LABOR   Menstrual-like cramps.  Contractions that are 5 minutes apart or less.  Contractions that start on the top of the uterus and spread down to the lower abdomen and back.  A sense of increased pelvic  pressure or back pain.  A watery or bloody mucus discharge that comes from the vagina. If you have any of these signs before the 37th week of pregnancy, call your caregiver right away. You need to go to the hospital to get checked immediately. HOME CARE INSTRUCTIONS   Avoid all smoking, herbs, alcohol, and unprescribed drugs. These chemicals affect the formation and growth of the baby.  Follow your caregiver's instructions regarding medicine use. There are medicines that are either safe or unsafe to take during pregnancy.  Exercise only as directed by your caregiver. Experiencing uterine cramps is a good sign to stop exercising.  Continue to eat regular, healthy meals.  Wear a good support bra for breast tenderness.  Do not use hot tubs, steam rooms, or saunas.  Wear your seat belt at all times when driving.  Avoid raw meat, uncooked cheese, cat litter boxes, and soil used by cats. These carry germs that can cause birth defects in the baby.  Take your prenatal vitamins.  Try taking a stool softener (if your caregiver approves) if you develop constipation. Eat more high-fiber foods, such as fresh vegetables or fruit and whole grains. Drink plenty of fluids to keep your urine clear or pale yellow.  Take warm sitz baths to soothe any pain or discomfort caused by hemorrhoids. Use hemorrhoid cream if your caregiver approves.  If you develop varicose veins, wear support hose. Elevate your feet for 15 minutes, 3-4 times a day. Limit salt in your diet.  Avoid heavy lifting, wear low heal shoes, and practice good posture.  Rest a lot with your legs elevated if you have leg cramps or low back pain.  Visit your dentist if you have not gone during your pregnancy. Use a soft toothbrush to brush your teeth and be gentle when you floss.  A sexual relationship may be continued unless your caregiver directs you otherwise.  Do not travel far distances unless it is absolutely necessary and only  with the approval of your caregiver.  Take prenatal classes to understand, practice, and ask questions about the labor and delivery.  Make a trial run to the hospital.  Pack your hospital bag.  Prepare the baby's nursery.  Continue to go to all your prenatal visits as directed by your caregiver. SEEK MEDICAL CARE IF:  You are unsure if you are in labor or if your water has broken.  You have dizziness.  You have mild pelvic cramps, pelvic pressure, or nagging pain in your abdominal area.  You have persistent nausea, vomiting, or diarrhea.  You have a bad smelling vaginal discharge.  You have pain with urination. SEEK IMMEDIATE MEDICAL CARE IF:   You have a fever.  You are leaking fluid from your vagina.  You have spotting or bleeding from your vagina.  You have severe abdominal cramping or pain.  You have rapid weight loss or gain.  You have shortness of breath with chest pain.  You notice sudden or extreme swelling   of your face, hands, ankles, feet, or legs.  You have not felt your baby move in over an hour.  You have severe headaches that do not go away with medicine.  You have vision changes. Document Released: 07/12/2001 Document Revised: 07/23/2013 Document Reviewed: 09/18/2012 [employer] Patient Information 2015 [employer]. This information is not intended to replace advice given to you by your health care provider. Make sure you discuss any questions you have with your health care provider.  Breastfeeding Deciding to breastfeed is one of the best choices you can make for you and your baby. A change in hormones during pregnancy causes your breast tissue to grow and increases the number and size of your milk ducts. These hormones also allow proteins, sugars, and fats from your blood supply to make breast milk in your milk-producing glands. Hormones prevent breast milk from being released before your baby is born as well as prompt milk flow after birth. Once  breastfeeding has begun, thoughts of your baby, as well as his or her sucking or crying, can stimulate the release of milk from your milk-producing glands.  BENEFITS OF BREASTFEEDING For Your Baby  Your first milk (colostrum) helps your baby's digestive system function better.   There are antibodies in your milk that help your baby fight off infections.   Your baby has a lower incidence of asthma, allergies, and sudden infant death syndrome.   The nutrients in breast milk are better for your baby than infant formulas and are designed uniquely for your baby's needs.   Breast milk improves your baby's brain development.   Your baby is less likely to develop other conditions, such as childhood obesity, asthma, or type 2 diabetes mellitus.  For You   Breastfeeding helps to create a very special bond between you and your baby.   Breastfeeding is convenient. Breast milk is always available at the correct temperature and costs nothing.   Breastfeeding helps to burn calories and helps you lose the weight gained during pregnancy.   Breastfeeding makes your uterus contract to its prepregnancy size faster and slows bleeding (lochia) after you give birth.   Breastfeeding helps to lower your risk of developing type 2 diabetes mellitus, osteoporosis, and breast or ovarian cancer later in life. SIGNS THAT YOUR BABY IS HUNGRY Early Signs of Hunger  Increased alertness or activity.  Stretching.  Movement of the head from side to side.  Movement of the head and opening of the mouth when the corner of the mouth or cheek is stroked (rooting).  Increased sucking sounds, smacking lips, cooing, sighing, or squeaking.  Hand-to-mouth movements.  Increased sucking of fingers or hands. Late Signs of Hunger  Fussing.  Intermittent crying. Extreme Signs of Hunger Signs of extreme hunger will require calming and consoling before your baby will be able to breastfeed successfully. Do not  wait for the following signs of extreme hunger to occur before you initiate breastfeeding:   Restlessness.  A loud, strong cry.   Screaming. BREASTFEEDING BASICS Breastfeeding Initiation  Find a comfortable place to sit or lie down, with your neck and back well supported.  Place a pillow or rolled up blanket under your baby to bring him or her to the level of your breast (if you are seated). Nursing pillows are specially designed to help support your arms and your baby while you breastfeed.  Make sure that your baby's abdomen is facing your abdomen.   Gently massage your breast. With your fingertips, massage from your chest   wall toward your nipple in a circular motion. This encourages milk flow. You may need to continue this action during the feeding if your milk flows slowly.  Support your breast with 4 fingers underneath and your thumb above your nipple. Make sure your fingers are well away from your nipple and your baby's mouth.   Stroke your baby's lips gently with your finger or nipple.   When your baby's mouth is open wide enough, quickly bring your baby to your breast, placing your entire nipple and as much of the colored area around your nipple (areola) as possible into your baby's mouth.   More areola should be visible above your baby's upper lip than below the lower lip.   Your baby's tongue should be between his or her lower gum and your breast.   Ensure that your baby's mouth is correctly positioned around your nipple (latched). Your baby's lips should create a seal on your breast and be turned out (everted).  It is common for your baby to suck about 2-3 minutes in order to start the flow of breast milk. Latching Teaching your baby how to latch on to your breast properly is very important. An improper latch can cause nipple pain and decreased milk supply for you and poor weight gain in your baby. Also, if your baby is not latched onto your nipple properly, he or she  may swallow some air during feeding. This can make your baby fussy. Burping your baby when you switch breasts during the feeding can help to get rid of the air. However, teaching your baby to latch on properly is still the best way to prevent fussiness from swallowing air while breastfeeding. Signs that your baby has successfully latched on to your nipple:    Silent tugging or silent sucking, without causing you pain.   Swallowing heard between every 3-4 sucks.    Muscle movement above and in front of his or her ears while sucking.  Signs that your baby has not successfully latched on to nipple:   Sucking sounds or smacking sounds from your baby while breastfeeding.  Nipple pain. If you think your baby has not latched on correctly, slip your finger into the corner of your baby's mouth to break the suction and place it between your baby's gums. Attempt breastfeeding initiation again. Signs of Successful Breastfeeding Signs from your baby:   A gradual decrease in the number of sucks or complete cessation of sucking.   Falling asleep.   Relaxation of his or her body.   Retention of a small amount of milk in his or her mouth.   Letting go of your breast by himself or herself. Signs from you:  Breasts that have increased in firmness, weight, and size 1-3 hours after feeding.   Breasts that are softer immediately after breastfeeding.  Increased milk volume, as well as a change in milk consistency and color by the fifth day of breastfeeding.   Nipples that are not sore, cracked, or bleeding. Signs That Your Baby is Getting Enough Milk  Wetting at least 3 diapers in a 24-hour period. The urine should be clear and pale yellow by age 5 days.  At least 3 stools in a 24-hour period by age 5 days. The stool should be soft and yellow.  At least 3 stools in a 24-hour period by age 7 days. The stool should be seedy and yellow.  No loss of weight greater than 10% of birth weight  during the first 3   days of age.  Average weight gain of 4-7 ounces (113-198 g) per week after age 4 days.  Consistent daily weight gain by age 5 days, without weight loss after the age of 2 weeks. After a feeding, your baby may spit up a small amount. This is common. BREASTFEEDING FREQUENCY AND DURATION Frequent feeding will help you make more milk and can prevent sore nipples and breast engorgement. Breastfeed when you feel the need to reduce the fullness of your breasts or when your baby shows signs of hunger. This is called "breastfeeding on demand." Avoid introducing a pacifier to your baby while you are working to establish breastfeeding (the first 4-6 weeks after your baby is born). After this time you may choose to use a pacifier. Research has shown that pacifier use during the first year of a baby's life decreases the risk of sudden infant death syndrome (SIDS). Allow your baby to feed on each breast as long as he or she wants. Breastfeed until your baby is finished feeding. When your baby unlatches or falls asleep while feeding from the first breast, offer the second breast. Because newborns are often sleepy in the first few weeks of life, you may need to awaken your baby to get him or her to feed. Breastfeeding times will vary from baby to baby. However, the following rules can serve as a guide to help you ensure that your baby is properly fed:  Newborns (babies 4 weeks of age or younger) may breastfeed every 1-3 hours.  Newborns should not go longer than 3 hours during the day or 5 hours during the night without breastfeeding.  You should breastfeed your baby a minimum of 8 times in a 24-hour period until you begin to introduce solid foods to your baby at around 6 months of age. BREAST MILK PUMPING Pumping and storing breast milk allows you to ensure that your baby is exclusively fed your breast milk, even at times when you are unable to breastfeed. This is especially important if you are  going back to work while you are still breastfeeding or when you are not able to be present during feedings. Your lactation consultant can give you guidelines on how long it is safe to store breast milk.  A breast pump is a machine that allows you to pump milk from your breast into a sterile bottle. The pumped breast milk can then be stored in a refrigerator or freezer. Some breast pumps are operated by hand, while others use electricity. Ask your lactation consultant which type will work best for you. Breast pumps can be purchased, but some hospitals and breastfeeding support groups lease breast pumps on a monthly basis. A lactation consultant can teach you how to hand express breast milk, if you prefer not to use a pump.  CARING FOR YOUR BREASTS WHILE YOU BREASTFEED Nipples can become dry, cracked, and sore while breastfeeding. The following recommendations can help keep your breasts moisturized and healthy:  Avoid using soap on your nipples.   Wear a supportive bra. Although not required, special nursing bras and tank tops are designed to allow access to your breasts for breastfeeding without taking off your entire bra or top. Avoid wearing underwire-style bras or extremely tight bras.  Air dry your nipples for 3-4minutes after each feeding.   Use only cotton bra pads to absorb leaked breast milk. Leaking of breast milk between feedings is normal.   Use lanolin on your nipples after breastfeeding. Lanolin helps to maintain your skin's   normal moisture barrier. If you use pure lanolin, you do not need to wash it off before feeding your baby again. Pure lanolin is not toxic to your baby. You may also hand express a few drops of breast milk and gently massage that milk into your nipples and allow the milk to air dry. In the first few weeks after giving birth, some women experience extremely full breasts (engorgement). Engorgement can make your breasts feel heavy, warm, and tender to the touch.  Engorgement peaks within 3-5 days after you give birth. The following recommendations can help ease engorgement:  Completely empty your breasts while breastfeeding or pumping. You may want to start by applying warm, moist heat (in the shower or with warm water-soaked hand towels) just before feeding or pumping. This increases circulation and helps the milk flow. If your baby does not completely empty your breasts while breastfeeding, pump any extra milk after he or she is finished.  Wear a snug bra (nursing or regular) or tank top for 1-2 days to signal your body to slightly decrease milk production.  Apply ice packs to your breasts, unless this is too uncomfortable for you.  Make sure that your baby is latched on and positioned properly while breastfeeding. If engorgement persists after 48 hours of following these recommendations, contact your health care provider or a lactation consultant. OVERALL HEALTH CARE RECOMMENDATIONS WHILE BREASTFEEDING  Eat healthy foods. Alternate between meals and snacks, eating 3 of each per day. Because what you eat affects your breast milk, some of the foods may make your baby more irritable than usual. Avoid eating these foods if you are sure that they are negatively affecting your baby.  Drink milk, fruit juice, and water to satisfy your thirst (about 10 glasses a day).   Rest often, relax, and continue to take your prenatal vitamins to prevent fatigue, stress, and anemia.  Continue breast self-awareness checks.  Avoid chewing and smoking tobacco.  Avoid alcohol and drug use. Some medicines that may be harmful to your baby can pass through breast milk. It is important to ask your health care provider before taking any medicine, including all over-the-counter and prescription medicine as well as vitamin and herbal supplements. It is possible to become pregnant while breastfeeding. If birth control is desired, ask your health care provider about options that  will be safe for your baby. SEEK MEDICAL CARE IF:   You feel like you want to stop breastfeeding or have become frustrated with breastfeeding.  You have painful breasts or nipples.  Your nipples are cracked or bleeding.  Your breasts are red, tender, or warm.  You have a swollen area on either breast.  You have a fever or chills.  You have nausea or vomiting.  You have drainage other than breast milk from your nipples.  Your breasts do not become full before feedings by the fifth day after you give birth.  You feel sad and depressed.  Your baby is too sleepy to eat well.  Your baby is having trouble sleeping.   Your baby is wetting less than 3 diapers in a 24-hour period.  Your baby has less than 3 stools in a 24-hour period.  Your baby's skin or the white part of his or her eyes becomes yellow.   Your baby is not gaining weight by 5 days of age. SEEK IMMEDIATE MEDICAL CARE IF:   Your baby is overly tired (lethargic) and does not want to wake up and feed.  Your baby   develops an unexplained fever. Document Released: 07/18/2005 Document Revised: 07/23/2013 Document Reviewed: 01/09/2013 [facility] Patient Information 2015 [place], [place]. This information is not intended to replace advice given to you by your health care provider. Make sure you discuss any questions you have with your health care provider.  Trial of Labor After Cesarean Delivery Information A trial of labor after cesarean delivery (TOLAC) is when a woman tries to give birth vaginally after a previous cesarean delivery. TOLAC may be a safe and appropriate option for you depending on your medical history and other risk factors. When TOLAC is successful and you are able to have a vaginal delivery, this is called a vaginal birth after cesarean delivery (VBAC).  CANDIDATES FOR TOLAC TOLAC is possible for some women who:  Have undergone one or two prior cesarean deliveries in which the incision of the uterus was  horizontal (low transverse).  Are carrying twins and have had one prior low transverse incision during a cesarean delivery.  Do not have a vertical (classical) uterine scar.  Have not had a tear in the wall of their uterus (uterine rupture). TOLAC is also supported for women who meet appropriate criteria and:  Are under the age of 40 years.  Are tall and have a body mass index (BMI) of less than 30.  Have an unknown uterine scar.  Give birth in a facility equipped to handle an emergency cesarean delivery. This team should be able to handle possible complications such as a uterine rupture.  Have thorough counseling about the benefits and risks of TOLAC.  Have discussed future pregnancy plans with their health care provider.  Plan to have several more pregnancies. MOST SUCCESSFUL CANDIDATES FOR TOLAC:  Have had a successful vaginal delivery before or after their cesarean delivery.  Experience labor that begins naturally on or before the due date (40 weeks of gestation).  Do not have a very large (macrosomic) baby.   Had a prior cesarean delivery but are not currently experiencing factors that would prompt a cesarean delivery (such as a breech position).  Had only one prior cesarean delivery.  Had a prior cesarean delivery that was performed early in labor and not after full cervical dilation. TOLAC may be most appropriate for women who meet the above guidelines and who plan to have more pregnancies. TOLAC is not recommended for home births. LEAST SUCCESSFUL CANDIDATES FOR TOLAC:  Have an induced labor with an unfavorable cervix. An unfavorable cervix is when the cervix is not dilating enough (among other factors).  Have never had a vaginal delivery.  Have had more than two cesarean deliveries.  Have a pregnancy at more than 40 weeks of gestation.  Are pregnant with a baby with a suspected weight greater than 4,000 grams (8 pounds) and who have no prior history of a  vaginal delivery.  Have closely spaced pregnancies. SUGGESTED BENEFITS OF TOLAC  You may have a faster recovery time.  You may have a shorter stay in the hospital.  You may have less pain and fewer problems than with a cesarean delivery. Women who have a cesarean delivery have a higher chance of needing blood or getting a fever, an infection, or a blood clot in the legs. SUGGESTED RISKS OF TOLAC The highest risk of complications happens to women who attempt a TOLAC and fail. A failed TOLAC results in an unplanned cesarean delivery. Risks related to [facility] or repeat cesarean deliveries include:   Blood loss.  Infection.  Blood clot.  Injury to  surrounding tissues or organs.  Having to remove the uterus (hysterectomy).  Potential problems with the placenta (such as placenta previa or placenta accreta) in future pregnancies. Although very rare, the main concerns with TOLAC are:  Rupture of the uterine scar from a past cesarean delivery.  Needing an emergency cesarean delivery.  Having a bad outcome for the baby (perinatal morbidity). FOR MORE INFORMATION [employer] of Obstetricians [employer] Gynecologists: www.acog.org [employer]: www.midwife.org Document Released: 04/05/2011 Document Revised: 05/08/2013 Document Reviewed: 01/07/2013 [facility] Patient Information 2015 [place], [place]. This information is not intended to replace advice given to you by your health care provider. Make sure you discuss any questions you have with your health care provider.

## 2014-08-19 ENCOUNTER — Other Ambulatory Visit (INDEPENDENT_AMBULATORY_CARE_PROVIDER_SITE_OTHER): Payer: PRIVATE HEALTH INSURANCE | Admitting: *Deleted

## 2014-08-19 DIAGNOSIS — Z36 Encounter for antenatal screening of mother: Secondary | ICD-10-CM

## 2014-08-19 DIAGNOSIS — Z3493 Encounter for supervision of normal pregnancy, unspecified, third trimester: Secondary | ICD-10-CM

## 2014-08-19 LAB — CBC
HEMATOCRIT: 34.9 % — AB (ref 36.0–46.0)
Hemoglobin: 11.6 g/dL — ABNORMAL LOW (ref 12.0–15.0)
MCH: 25.9 pg — AB (ref 26.0–34.0)
MCHC: 33.2 g/dL (ref 30.0–36.0)
MCV: 77.9 fL — ABNORMAL LOW (ref 78.0–100.0)
MPV: 11.1 fL (ref 8.6–12.4)
PLATELETS: 274 10*3/uL (ref 150–400)
RBC: 4.48 MIL/uL (ref 3.87–5.11)
RDW: 15 % (ref 11.5–15.5)
WBC: 8.5 10*3/uL (ref 4.0–10.5)

## 2014-08-19 NOTE — Progress Notes (Signed)
Patient here today for her 28 wk labs.

## 2014-08-20 LAB — RPR

## 2014-08-20 LAB — GLUCOSE TOLERANCE, 1 HOUR (50G) W/O FASTING: Glucose, 1 Hour GTT: 163 mg/dL — ABNORMAL HIGH (ref 70–140)

## 2014-08-20 LAB — HIV ANTIBODY (ROUTINE TESTING W REFLEX): HIV: NONREACTIVE

## 2014-09-03 ENCOUNTER — Encounter: Payer: PRIVATE HEALTH INSURANCE | Admitting: Obstetrics & Gynecology

## 2014-09-03 ENCOUNTER — Ambulatory Visit (INDEPENDENT_AMBULATORY_CARE_PROVIDER_SITE_OTHER): Payer: Self-pay | Admitting: Physician Assistant

## 2014-09-03 VITALS — BP 111/78 | HR 101 | Wt 214.0 lb

## 2014-09-03 DIAGNOSIS — Z3482 Encounter for supervision of other normal pregnancy, second trimester: Secondary | ICD-10-CM

## 2014-09-03 DIAGNOSIS — Z3483 Encounter for supervision of other normal pregnancy, third trimester: Secondary | ICD-10-CM

## 2014-09-03 DIAGNOSIS — O280 Abnormal hematological finding on antenatal screening of mother: Secondary | ICD-10-CM

## 2014-09-03 NOTE — Progress Notes (Signed)
30 weeks without complaint.   S>D.  Sched U/S to eval Pt to RTC asap for 3 hour gtt as she failed the one hour gtt.  RTC for OBF in 2 weeks

## 2014-09-03 NOTE — Patient Instructions (Signed)

## 2014-09-08 ENCOUNTER — Other Ambulatory Visit (INDEPENDENT_AMBULATORY_CARE_PROVIDER_SITE_OTHER): Payer: Self-pay | Admitting: *Deleted

## 2014-09-08 DIAGNOSIS — O99810 Abnormal glucose complicating pregnancy: Secondary | ICD-10-CM

## 2014-09-08 DIAGNOSIS — Z3A32 32 weeks gestation of pregnancy: Secondary | ICD-10-CM

## 2014-09-08 DIAGNOSIS — R7302 Impaired glucose tolerance (oral): Secondary | ICD-10-CM

## 2014-09-08 DIAGNOSIS — R7309 Other abnormal glucose: Secondary | ICD-10-CM

## 2014-09-09 ENCOUNTER — Encounter: Payer: Self-pay | Admitting: Obstetrics and Gynecology

## 2014-09-09 ENCOUNTER — Telehealth: Payer: Self-pay | Admitting: *Deleted

## 2014-09-09 DIAGNOSIS — O24419 Gestational diabetes mellitus in pregnancy, unspecified control: Secondary | ICD-10-CM

## 2014-09-09 LAB — GLUCOSE TOLERANCE, 3 HOURS
GLUCOSE 3 HOUR GTT: 169 mg/dL — AB (ref 70–144)
GLUCOSE, FASTING-GESTATIONAL: 104 mg/dL (ref 70–104)
Glucose Tolerance, 1 hour: 215 mg/dL — ABNORMAL HIGH (ref 70–189)
Glucose Tolerance, 2 hour: 189 mg/dL — ABNORMAL HIGH (ref 70–164)

## 2014-09-09 NOTE — Telephone Encounter (Signed)
Notified patient of positive test results for gestational diabetes and sent information to Darl PikesSusan to be referred to Diabetes and nutrition.

## 2014-09-15 ENCOUNTER — Ambulatory Visit (HOSPITAL_COMMUNITY)
Admission: RE | Admit: 2014-09-15 | Discharge: 2014-09-15 | Disposition: A | Discharge status: Home / Self Care | Payer: Self-pay | Source: Ambulatory Visit | Attending: Physician Assistant | Admitting: Physician Assistant

## 2014-09-15 DIAGNOSIS — O99213 Obesity complicating pregnancy, third trimester: Secondary | ICD-10-CM | POA: Insufficient documentation

## 2014-09-15 DIAGNOSIS — Z6837 Body mass index (BMI) 37.0-37.9, adult: Secondary | ICD-10-CM | POA: Insufficient documentation

## 2014-09-15 DIAGNOSIS — Z3A32 32 weeks gestation of pregnancy: Secondary | ICD-10-CM | POA: Insufficient documentation

## 2014-09-15 DIAGNOSIS — Z0489 Encounter for examination and observation for other specified reasons: Secondary | ICD-10-CM | POA: Insufficient documentation

## 2014-09-15 DIAGNOSIS — Z3483 Encounter for supervision of other normal pregnancy, third trimester: Secondary | ICD-10-CM

## 2014-09-15 DIAGNOSIS — O24419 Gestational diabetes mellitus in pregnancy, unspecified control: Secondary | ICD-10-CM | POA: Insufficient documentation

## 2014-09-15 DIAGNOSIS — IMO0002 Reserved for concepts with insufficient information to code with codable children: Secondary | ICD-10-CM | POA: Insufficient documentation

## 2014-09-15 DIAGNOSIS — O3421 Maternal care for scar from previous cesarean delivery: Secondary | ICD-10-CM | POA: Insufficient documentation

## 2014-09-17 ENCOUNTER — Ambulatory Visit (INDEPENDENT_AMBULATORY_CARE_PROVIDER_SITE_OTHER): Payer: Self-pay | Admitting: Obstetrics and Gynecology

## 2014-09-17 ENCOUNTER — Encounter: Payer: Self-pay | Admitting: Obstetrics and Gynecology

## 2014-09-17 VITALS — BP 111/71 | HR 109 | Wt 213.0 lb

## 2014-09-17 DIAGNOSIS — O24410 Gestational diabetes mellitus in pregnancy, diet controlled: Secondary | ICD-10-CM

## 2014-09-17 DIAGNOSIS — Z3482 Encounter for supervision of other normal pregnancy, second trimester: Secondary | ICD-10-CM

## 2014-09-17 DIAGNOSIS — O3421 Maternal care for scar from previous cesarean delivery: Secondary | ICD-10-CM

## 2014-09-17 DIAGNOSIS — O34219 Maternal care for unspecified type scar from previous cesarean delivery: Secondary | ICD-10-CM

## 2014-09-17 DIAGNOSIS — O99210 Obesity complicating pregnancy, unspecified trimester: Secondary | ICD-10-CM

## 2014-09-17 NOTE — Progress Notes (Signed)
Patient is doing well without complaints. FM/PTL precautions reviewed. Discussed meaning of gestational diabetes and its maternal/fetal implications if poorly controlled including but not limited to risks of fetal macrosomia, shoulder dystocia with its associated neurological sequalae, risks of fetal/neonatal demise. Patient verbalized understanding and will be scheduled to meet with diabetic educator on 2/24.  EFW 2/15-  2132 gm (71%tile) Patient desires RCS and will be scheduled at 39 weeks

## 2014-09-22 ENCOUNTER — Ambulatory Visit: Payer: Self-pay | Admitting: *Deleted

## 2014-09-22 ENCOUNTER — Encounter: Payer: Self-pay | Attending: Obstetrics and Gynecology | Admitting: *Deleted

## 2014-09-22 VITALS — Wt 212.7 lb

## 2014-09-22 DIAGNOSIS — O24419 Gestational diabetes mellitus in pregnancy, unspecified control: Secondary | ICD-10-CM

## 2014-09-22 DIAGNOSIS — O24410 Gestational diabetes mellitus in pregnancy, diet controlled: Secondary | ICD-10-CM | POA: Insufficient documentation

## 2014-09-22 DIAGNOSIS — Z713 Dietary counseling and surveillance: Secondary | ICD-10-CM | POA: Insufficient documentation

## 2014-09-22 NOTE — Progress Notes (Signed)
Nutrition note: GDM diet education Pt was obese prior to pregnancy & is a newly diagnosed GDM pt. Pt has gained 12.7# @ 1560w3d, which is wnl. Pt reports eating 3 meals & 2-3 snacks/d. Pt is taking a PNV. Pt reports nausea & heartburn occ but no vomiting. NKFA. Pt reports doing an exercise video 2x/ wk for ~20 mins. Pt received verbal & written education on GDM diet. Discussed tips to decrease nausea & heartburn. Encouraged walking or physical activity daily. Discussed wt gain goals of 11-20# or 0.5#/wk. Pt agrees to follow GDM diet with 3 meals & 1-3 snacks/d with proper CHO/ protein combination. Pt has WIC & plans to BF. F/u in 2-4 wks Brittany RevealLaura Roc Streett, MS, RD, LDN, [facility] CenterBCLC

## 2014-09-22 NOTE — Progress Notes (Signed)
  Patient was seen on 09/22/14 for Gestational Diabetes self-management . The following learning objectives were met by the patient :   States the definition of Gestational Diabetes  States when to check blood glucose levels  Demonstrates proper blood glucose monitoring techniques  States the effect of stress and exercise on blood glucose levels  States the importance of limiting caffeine and abstaining from alcohol and smoking  Plan:  Consider  increasing your activity level by walking daily as tolerated Begin checking BG before breakfast and 2 hours after first bite of breakfast, lunch and dinner after  as directed by MD  Take medication  as directed by MD  Blood glucose monitor given: TrueTrack Lot # [id number] Exp: 2016/02/29 Blood glucose reading: FBS 180m/dl  Patient instructed to monitor glucose levels: FBS: 60 - <90 2 hour: <120  Patient received the following handouts:  Nutrition Diabetes and Pregnancy  Carbohydrate Counting List  Meal Planning worksheet  Patient will be seen for follow-up as needed.

## 2014-09-24 ENCOUNTER — Ambulatory Visit: Payer: Self-pay

## 2014-09-29 ENCOUNTER — Ambulatory Visit (INDEPENDENT_AMBULATORY_CARE_PROVIDER_SITE_OTHER): Payer: Self-pay | Admitting: Obstetrics and Gynecology

## 2014-09-29 ENCOUNTER — Encounter: Payer: Self-pay | Admitting: Obstetrics and Gynecology

## 2014-09-29 ENCOUNTER — Telehealth: Payer: Self-pay | Admitting: *Deleted

## 2014-09-29 VITALS — BP 123/70 | HR 97 | Wt 211.7 lb

## 2014-09-29 DIAGNOSIS — O34219 Maternal care for unspecified type scar from previous cesarean delivery: Secondary | ICD-10-CM

## 2014-09-29 DIAGNOSIS — O3421 Maternal care for scar from previous cesarean delivery: Secondary | ICD-10-CM

## 2014-09-29 DIAGNOSIS — Z3482 Encounter for supervision of other normal pregnancy, second trimester: Secondary | ICD-10-CM

## 2014-09-29 DIAGNOSIS — O24410 Gestational diabetes mellitus in pregnancy, diet controlled: Secondary | ICD-10-CM

## 2014-09-29 LAB — POCT URINALYSIS DIP (DEVICE)
Bilirubin Urine: NEGATIVE
Glucose, UA: NEGATIVE mg/dL
Ketones, ur: NEGATIVE mg/dL
Leukocytes, UA: NEGATIVE
Nitrite: NEGATIVE
PH: 6.5 (ref 5.0–8.0)
Protein, ur: 100 mg/dL — AB
Specific Gravity, Urine: 1.02 (ref 1.005–1.030)
UROBILINOGEN UA: 0.2 mg/dL (ref 0.0–1.0)

## 2014-09-29 MED ORDER — GLYBURIDE 2.5 MG PO TABS: 2.5000 mg | ORAL_TABLET | Freq: Two times a day (BID) | ORAL | Status: DC

## 2014-09-29 NOTE — Progress Notes (Signed)
Pt met with nutrition and diabetes educator last week. She has started testing her glucose levels.

## 2014-09-29 NOTE — Telephone Encounter (Addendum)
Called pt and left message that I am calling about an upcoming appt which needs to be changed. Please call back.  Pt needs appt on 3/3 or 3/4 instead of 3/2.  Pt also needs appt on 3/7 for Ob fu and NST/AFI.  3/2 0800  Per chart review, pt rescheduled appt to 3/3 @ 1600.

## 2014-09-29 NOTE — Progress Notes (Signed)
29 y.o. G2P1001 at 4855w3d here for follow up OB visit. Doing well today. No concerns or complaints. 1. GDM. Reviewed blood glucose log and >50% fasting and postprandial blood glucose values above goal. Start glyburide 2.5 mg BID. Will need twice weekly NSTs starting this week. Continue diet/exercise. Growth ultrasound 2/15 EFW 2132 gm (71%ile). 2. History of C-section. Desires scheduled repeat. Plan for 39 weeks.  3. Routine PNC. Lab work reviewed. FM/PTL precautions reviewed. Start twice weekly NST this week and RTC in 1 week for provider visit.

## 2014-10-01 ENCOUNTER — Other Ambulatory Visit: Payer: Self-pay

## 2014-10-01 ENCOUNTER — Encounter: Payer: Self-pay | Admitting: Obstetrics and Gynecology

## 2014-10-02 ENCOUNTER — Ambulatory Visit (INDEPENDENT_AMBULATORY_CARE_PROVIDER_SITE_OTHER): Payer: Self-pay | Admitting: *Deleted

## 2014-10-02 VITALS — BP 109/72 | HR 125

## 2014-10-02 DIAGNOSIS — O24419 Gestational diabetes mellitus in pregnancy, unspecified control: Secondary | ICD-10-CM

## 2014-10-06 ENCOUNTER — Ambulatory Visit (INDEPENDENT_AMBULATORY_CARE_PROVIDER_SITE_OTHER): Payer: Self-pay | Admitting: Obstetrics and Gynecology

## 2014-10-06 VITALS — BP 108/70 | HR 114 | Temp 97.3°F | Wt 211.4 lb

## 2014-10-06 DIAGNOSIS — L298 Other pruritus: Secondary | ICD-10-CM

## 2014-10-06 DIAGNOSIS — O24419 Gestational diabetes mellitus in pregnancy, unspecified control: Secondary | ICD-10-CM

## 2014-10-06 DIAGNOSIS — N898 Other specified noninflammatory disorders of vagina: Secondary | ICD-10-CM

## 2014-10-06 LAB — US OB FOLLOW UP

## 2014-10-06 LAB — POCT URINALYSIS DIP (DEVICE)
BILIRUBIN URINE: NEGATIVE
GLUCOSE, UA: NEGATIVE mg/dL
Ketones, ur: NEGATIVE mg/dL
Leukocytes, UA: NEGATIVE
Nitrite: NEGATIVE
Protein, ur: 100 mg/dL — AB
Specific Gravity, Urine: 1.02 (ref 1.005–1.030)
UROBILINOGEN UA: 0.2 mg/dL (ref 0.0–1.0)
pH: 6.5 (ref 5.0–8.0)

## 2014-10-06 MED ORDER — ONDANSETRON HCL 4 MG PO TABS: 4.0000 mg | ORAL_TABLET | Freq: Three times a day (TID) | ORAL | Status: DC | PRN

## 2014-10-06 MED ORDER — GLYBURIDE 2.5 MG PO TABS: ORAL_TABLET | ORAL | Status: DC

## 2014-10-06 NOTE — Progress Notes (Signed)
Patient reports thick white d/c with itching; states she stopped taking the glyburide 2-3 days ago because she started to have itching all over

## 2014-10-06 NOTE — Progress Notes (Signed)
29 y.o. G2P1001 at 6770w3d here for routine OB visit. Doing well today. Mild nausea starting this morning and has not taken medications due to this. 1. A2GDM. Reviewed blood glucose log today and > 50% AM fasting still above goal while post prandials mostly controlled. Increase glyburide 2.5 qAM, 5 mg qPM. Continue diet/exercise. NST today reactive. 150/mod/+A/-D. Growth ultrasound 2/15 EFW 2132 gm (71%ile). Will need follow up growth at 36 weeks, ordered today. 2. History of C-section. Desires scheduled repeat. Plan for 39 weeks.  3. Vaginal itching. Wet prep sent today.  4. Routine PNC. Reviewed labs. Will need 36 weeks swabs next week. FM/PTL precautions reviewed.

## 2014-10-06 NOTE — Progress Notes (Signed)
NST reviewed and reactive.  Storm Dulski L. Harraway-Smith, M.D., FACOG    

## 2014-10-07 LAB — WET PREP, GENITAL
CLUE CELLS WET PREP: NONE SEEN
Trich, Wet Prep: NONE SEEN
Yeast Wet Prep HPF POC: NONE SEEN

## 2014-10-10 ENCOUNTER — Ambulatory Visit (INDEPENDENT_AMBULATORY_CARE_PROVIDER_SITE_OTHER): Payer: Self-pay | Admitting: *Deleted

## 2014-10-10 VITALS — BP 113/63 | HR 108

## 2014-10-10 DIAGNOSIS — O24419 Gestational diabetes mellitus in pregnancy, unspecified control: Secondary | ICD-10-CM

## 2014-10-10 NOTE — Progress Notes (Signed)
NST reactive.

## 2014-10-13 ENCOUNTER — Ambulatory Visit (INDEPENDENT_AMBULATORY_CARE_PROVIDER_SITE_OTHER): Payer: Self-pay | Admitting: Family Medicine

## 2014-10-13 VITALS — BP 110/63 | HR 111 | Wt 212.1 lb

## 2014-10-13 DIAGNOSIS — O24419 Gestational diabetes mellitus in pregnancy, unspecified control: Secondary | ICD-10-CM

## 2014-10-13 LAB — POCT URINALYSIS DIP (DEVICE)
BILIRUBIN URINE: NEGATIVE
Glucose, UA: NEGATIVE mg/dL
Ketones, ur: NEGATIVE mg/dL
Leukocytes, UA: NEGATIVE
NITRITE: NEGATIVE
PROTEIN: 30 mg/dL — AB
Specific Gravity, Urine: 1.02 (ref 1.005–1.030)
Urobilinogen, UA: 0.2 mg/dL (ref 0.0–1.0)
pH: 7 (ref 5.0–8.0)

## 2014-10-13 LAB — US OB FOLLOW UP

## 2014-10-13 NOTE — Progress Notes (Signed)
Patient is 29 y.o. G2P1001 5634w3d.  +FM, denies LOF, VB, contractions, vaginal discharge. # still desires repeat c/s # G/C today, no need for GBS 2/2 repeat c/s #A2DM: better controlled on glyburide 2.5mg  qAM and 5mg  qHS  Fasting: 3/7 out or range, 108/94/100  2h PP bfast: 0/6; lunch 0/6, dinner 0/6 out of range.  Much better controlled.  No changes in medication at this time.

## 2014-10-14 LAB — GC/CHLAMYDIA PROBE AMP
CT PROBE, AMP APTIMA: NEGATIVE
GC Probe RNA: NEGATIVE

## 2014-10-17 ENCOUNTER — Ambulatory Visit (INDEPENDENT_AMBULATORY_CARE_PROVIDER_SITE_OTHER): Payer: Self-pay | Admitting: *Deleted

## 2014-10-17 VITALS — BP 105/63 | HR 94

## 2014-10-17 DIAGNOSIS — O24419 Gestational diabetes mellitus in pregnancy, unspecified control: Secondary | ICD-10-CM

## 2014-10-19 NOTE — Progress Notes (Signed)
3/18 NST reviewed and reactive 

## 2014-10-20 ENCOUNTER — Ambulatory Visit (HOSPITAL_COMMUNITY)
Admission: RE | Admit: 2014-10-20 | Discharge: 2014-10-20 | Disposition: A | Discharge status: Home / Self Care | Payer: Self-pay | Source: Ambulatory Visit | Attending: Obstetrics and Gynecology | Admitting: Obstetrics and Gynecology

## 2014-10-20 ENCOUNTER — Ambulatory Visit (INDEPENDENT_AMBULATORY_CARE_PROVIDER_SITE_OTHER): Payer: Self-pay | Admitting: Obstetrics & Gynecology

## 2014-10-20 VITALS — BP 116/73 | HR 86 | Temp 97.4°F | Wt 209.3 lb

## 2014-10-20 DIAGNOSIS — O3421 Maternal care for scar from previous cesarean delivery: Secondary | ICD-10-CM

## 2014-10-20 DIAGNOSIS — O24419 Gestational diabetes mellitus in pregnancy, unspecified control: Secondary | ICD-10-CM

## 2014-10-20 DIAGNOSIS — Z3A36 36 weeks gestation of pregnancy: Secondary | ICD-10-CM | POA: Insufficient documentation

## 2014-10-20 DIAGNOSIS — O99213 Obesity complicating pregnancy, third trimester: Secondary | ICD-10-CM | POA: Insufficient documentation

## 2014-10-20 DIAGNOSIS — O34219 Maternal care for unspecified type scar from previous cesarean delivery: Secondary | ICD-10-CM

## 2014-10-20 DIAGNOSIS — Z3A37 37 weeks gestation of pregnancy: Secondary | ICD-10-CM | POA: Insufficient documentation

## 2014-10-20 DIAGNOSIS — Z6837 Body mass index (BMI) 37.0-37.9, adult: Secondary | ICD-10-CM | POA: Insufficient documentation

## 2014-10-20 LAB — POCT URINALYSIS DIP (DEVICE)
Bilirubin Urine: NEGATIVE
GLUCOSE, UA: NEGATIVE mg/dL
HGB URINE DIPSTICK: NEGATIVE
KETONES UR: NEGATIVE mg/dL
Leukocytes, UA: NEGATIVE
NITRITE: NEGATIVE
Protein, ur: 100 mg/dL — AB
Specific Gravity, Urine: 1.02 (ref 1.005–1.030)
UROBILINOGEN UA: 0.2 mg/dL (ref 0.0–1.0)
pH: 7 (ref 5.0–8.0)

## 2014-10-20 NOTE — Progress Notes (Signed)
US for growth and BPP done today 

## 2014-10-20 NOTE — Progress Notes (Signed)
Repeat CS 10/31/14 39 weeks. BG  FBS all but 2 nl, all PP in range   NST Rx, BPP 10/10, AFI 14 and 82 %ile growth

## 2014-10-20 NOTE — Patient Instructions (Signed)
Cesarean Delivery  Cesarean delivery is the birth of a baby through a cut (incision) in the abdomen and womb (uterus).  LET YOUR HEALTH CARE PROVIDER KNOW ABOUT:  All medicines you are taking, including vitamins, herbs, eye drops, creams, and over-the-counter medicines.  Previous problems you or members of your family have had with the use of anesthetics.  Any blood disorders you have.  Previous surgeries you have had.  Medical conditions you have.  Any allergies you have.  Complicationsinvolving the pregnancy. RISKS AND COMPLICATIONS  Generally, this is a safe procedure. However, as with any procedure, complications can occur. Possible complications include:  Bleeding.  Infection.  Blood clots.  Injury to surrounding organs.  Problems with anesthesia.  Injury to the baby. BEFORE THE PROCEDURE   You may be given an antacid medicine to drink. This will prevent acid contents in your stomach from going into your lungs if you vomit during the surgery.  You may be given an antibiotic medicine to prevent infection. PROCEDURE   Hair may be removed from your pubic area and your lower abdomen. This is to prevent infection in the incision site.  A tube (Foley catheter) will be placed in your bladder to drain your urine from your bladder into a bag. This keeps your bladder empty during surgery.  An IV tube will be placed in your vein.  You may be given medicine to numb the lower half of your body (regional anesthetic). If you were in labor, you may have already had an epidural in place which can be used in both labor and cesarean delivery. You may possibly be given medicine to make you sleep (general anesthetic) though this is not as common.  An incision will be made in your abdomen that extends to your uterus. There are 2 basic kinds of incisions:  The horizontal (transverse) incision. Horizontal incisions are from side to side and are used for most routine cesarean  deliveries.  The vertical incision. The vertical incision is from the top of the abdomen to the bottom and is less commonly used. It is often done for women who have a serious complication (extreme prematurity) or under emergency situations.  The horizontal and vertical incisions may both be used at the same time. However, this is very uncommon.  An incision is then made in your uterus to deliver the baby.  Your baby will then be delivered.  Both incisions are then closed with absorbable stitches. AFTER THE PROCEDURE   If you were awake during the surgery, you will see your baby right away. If you were asleep, you will see your baby as soon as you are awake.  You may breastfeed your baby after surgery.  You may be able to get up and walk the same day as the surgery. If you need to stay in bed for a period of time, you will receive help to turn, cough, and take deep breaths after surgery. This helps prevent lung problems such as pneumonia.  Do not get out of bed alone the first time after surgery. You will need help getting out of bed until you are able to do this by yourself.  You may be able to shower the day after your cesarean delivery. After the bandage (dressing) is taken off the incision site, a nurse will assist you to shower if you would like help.  You will have pneumatic compression hose placed on your lower legs. This is done to prevent blood clots. When you are up   and walking regularly, they will no longer be necessary.  Do not cross your legs when you sit.  Save any blood clots that you pass. If you pass a clot while on the toilet, do not flush it. Call for the nurse. Tell the nurse if you think you are bleeding too much or passing too many clots.  You will be given medicine as needed. Let your health care providers know if you are hurting. You may also be given an antibiotic to prevent an infection.  Your IV tube will be taken out when you are drinking a reasonable  amount of fluids. The Foley catheter is taken out when you are up and walking.  If your blood type is Rh negative and your baby's blood type is Rh positive, you will be given a shot of anti-D immune globulin. This shot prevents you from having Rh problems with a future pregnancy. You should get the shot even if you had your tubes tied (tubal ligation).  If you are allowed to take the baby for a walk, place the baby in the bassinet and push it. Do not carry your baby in your arms. Document Released: 07/18/2005 Document Revised: 05/08/2013 Document Reviewed: 02/06/2013 ExitCare Patient Information 2015 ExitCare, LLC. This information is not intended to replace advice given to you by your health care provider. Make sure you discuss any questions you have with your health care provider.  

## 2014-10-27 ENCOUNTER — Ambulatory Visit (INDEPENDENT_AMBULATORY_CARE_PROVIDER_SITE_OTHER): Payer: Self-pay | Admitting: Family Medicine

## 2014-10-27 VITALS — BP 114/69 | HR 94 | Temp 97.7°F | Wt 212.0 lb

## 2014-10-27 DIAGNOSIS — O3421 Maternal care for scar from previous cesarean delivery: Secondary | ICD-10-CM

## 2014-10-27 DIAGNOSIS — Z3482 Encounter for supervision of other normal pregnancy, second trimester: Secondary | ICD-10-CM

## 2014-10-27 DIAGNOSIS — O34219 Maternal care for unspecified type scar from previous cesarean delivery: Secondary | ICD-10-CM

## 2014-10-27 DIAGNOSIS — O24419 Gestational diabetes mellitus in pregnancy, unspecified control: Secondary | ICD-10-CM

## 2014-10-27 LAB — POCT URINALYSIS DIP (DEVICE)
BILIRUBIN URINE: NEGATIVE
Glucose, UA: NEGATIVE mg/dL
HGB URINE DIPSTICK: NEGATIVE
Ketones, ur: NEGATIVE mg/dL
NITRITE: NEGATIVE
PH: 7 (ref 5.0–8.0)
Protein, ur: 30 mg/dL — AB
Specific Gravity, Urine: 1.015 (ref 1.005–1.030)
UROBILINOGEN UA: 0.2 mg/dL (ref 0.0–1.0)

## 2014-10-27 LAB — US OB FOLLOW UP

## 2014-10-27 NOTE — Progress Notes (Signed)
NST reactive. No questions or concerns.   CBGs within range. Repeat c/s on Friday

## 2014-10-30 ENCOUNTER — Encounter (HOSPITAL_COMMUNITY): Payer: Self-pay

## 2014-10-30 ENCOUNTER — Encounter (HOSPITAL_COMMUNITY)
Admission: RE | Admit: 2014-10-30 | Discharge: 2014-10-30 | Disposition: A | Discharge status: Home / Self Care | Payer: Self-pay | Source: Ambulatory Visit | Attending: Obstetrics & Gynecology | Admitting: Obstetrics & Gynecology

## 2014-10-30 ENCOUNTER — Encounter: Payer: Self-pay | Admitting: Obstetrics & Gynecology

## 2014-10-30 VITALS — BP 114/83 | HR 99 | Resp 20 | Ht 62.0 in | Wt 212.0 lb

## 2014-10-30 DIAGNOSIS — O34219 Maternal care for unspecified type scar from previous cesarean delivery: Secondary | ICD-10-CM

## 2014-10-30 DIAGNOSIS — Z01812 Encounter for preprocedural laboratory examination: Secondary | ICD-10-CM | POA: Insufficient documentation

## 2014-10-30 HISTORY — DX: Unspecified diabetes mellitus in pregnancy, unspecified trimester: O24.919

## 2014-10-30 HISTORY — DX: Sickle-cell trait: D57.3

## 2014-10-30 LAB — BASIC METABOLIC PANEL
Anion gap: 7 (ref 5–15)
BUN: 5 mg/dL — ABNORMAL LOW (ref 6–23)
CO2: 24 mmol/L (ref 19–32)
Calcium: 8.7 mg/dL (ref 8.4–10.5)
Chloride: 105 mmol/L (ref 96–112)
Creatinine, Ser: 0.68 mg/dL (ref 0.50–1.10)
GFR calc Af Amer: >90 mL/min (ref >90)
GFR calc non Af Amer: >90 mL/min (ref >90)
Glucose, Bld: 101 mg/dL — ABNORMAL HIGH (ref 70–99)
Potassium: 4 mmol/L (ref 3.5–5.1)
SODIUM: 136 mmol/L (ref 135–145)

## 2014-10-30 LAB — CBC
HCT: 34.4 % — ABNORMAL LOW (ref 36.0–46.0)
HEMOGLOBIN: 11.1 g/dL — AB (ref 12.0–15.0)
MCH: 24.6 pg — AB (ref 26.0–34.0)
MCHC: 32.3 g/dL (ref 30.0–36.0)
MCV: 76.1 fL — AB (ref 78.0–100.0)
Platelets: 240 10*3/uL (ref 150–400)
RBC: 4.52 MIL/uL (ref 3.87–5.11)
RDW: 15.2 % (ref 11.5–15.5)
WBC: 6.2 10*3/uL (ref 4.0–10.5)

## 2014-10-30 LAB — ABO/RH: ABO/RH(D): O POS

## 2014-10-30 MED ORDER — DEXTROSE 5 % IV SOLN
2.0000 g | INTRAVENOUS | Status: AC
  Administered 2014-10-31: 2 g via INTRAVENOUS
  Filled 2014-10-30: qty 2

## 2014-10-30 NOTE — Patient Instructions (Addendum)
   Your procedure is scheduled on:  Friday, April 1  [facility] at: 8:30 AM Pick up the phone at the desk and dial [phone] and inform us of your arrival.  Please call this number if you have any problems the morning of surgery: [phone]  Remember: Do not eat or drink after midnight: tonight- Thursday Take these medicines the morning of surgery with a SIP OF WATER:  None.  Do Not Take Diabetes medication on day of surgery.  We will check your sugar upon arrival to Short Stay Dept.  Do not wear jewelry, make-up, or FINGER nail polish No metal in your hair or on your body. Do not wear lotions, powders, perfumes.  You may wear deodorant.  Do not bring valuables to the hospital. Contacts, dentures or bridgework may not be worn into surgery.  Leave suitcase in the car. After Surgery it may be brought to your room. For patients being admitted to the hospital, checkout time is 11:00am the day of discharge.  Home with husband Emmenuel cell [phone]

## 2014-10-31 ENCOUNTER — Inpatient Hospital Stay (HOSPITAL_COMMUNITY)
Admission: RE | Admit: 2014-10-31 | Discharge: 2014-11-02 | DRG: 766 | Disposition: A | Discharge status: Home / Self Care | Payer: Self-pay | Source: Ambulatory Visit | Attending: Obstetrics & Gynecology | Admitting: Obstetrics & Gynecology

## 2014-10-31 ENCOUNTER — Encounter (HOSPITAL_COMMUNITY)
Admission: RE | Disposition: A | Discharge status: Home / Self Care | Payer: Self-pay | Source: Ambulatory Visit | Attending: Obstetrics & Gynecology

## 2014-10-31 ENCOUNTER — Inpatient Hospital Stay (HOSPITAL_COMMUNITY): Payer: Self-pay | Admitting: Anesthesiology

## 2014-10-31 ENCOUNTER — Encounter (HOSPITAL_COMMUNITY): Payer: Self-pay | Admitting: *Deleted

## 2014-10-31 DIAGNOSIS — O3421 Maternal care for scar from previous cesarean delivery: Secondary | ICD-10-CM

## 2014-10-31 DIAGNOSIS — Z3A39 39 weeks gestation of pregnancy: Secondary | ICD-10-CM

## 2014-10-31 DIAGNOSIS — O99214 Obesity complicating childbirth: Secondary | ICD-10-CM | POA: Diagnosis present

## 2014-10-31 DIAGNOSIS — O24419 Gestational diabetes mellitus in pregnancy, unspecified control: Secondary | ICD-10-CM | POA: Diagnosis present

## 2014-10-31 DIAGNOSIS — O3663X0 Maternal care for excessive fetal growth, third trimester, not applicable or unspecified: Secondary | ICD-10-CM | POA: Diagnosis present

## 2014-10-31 DIAGNOSIS — Z98891 History of uterine scar from previous surgery: Secondary | ICD-10-CM

## 2014-10-31 DIAGNOSIS — E119 Type 2 diabetes mellitus without complications: Secondary | ICD-10-CM

## 2014-10-31 DIAGNOSIS — O2432 Unspecified pre-existing diabetes mellitus in childbirth: Secondary | ICD-10-CM

## 2014-10-31 DIAGNOSIS — Z79899 Other long term (current) drug therapy: Secondary | ICD-10-CM

## 2014-10-31 DIAGNOSIS — Z6838 Body mass index (BMI) 38.0-38.9, adult: Secondary | ICD-10-CM

## 2014-10-31 DIAGNOSIS — O34219 Maternal care for unspecified type scar from previous cesarean delivery: Secondary | ICD-10-CM

## 2014-10-31 LAB — GLUCOSE, CAPILLARY
GLUCOSE-CAPILLARY: 82 mg/dL (ref 70–99)
GLUCOSE-CAPILLARY: 70 mg/dL (ref 70–99)

## 2014-10-31 LAB — RPR: RPR: NONREACTIVE

## 2014-10-31 LAB — PREPARE RBC (CROSSMATCH)

## 2014-10-31 SURGERY — Surgical Case
Anesthesia: Spinal | Site: Abdomen

## 2014-10-31 MED ORDER — ONDANSETRON HCL 4 MG/2ML IJ SOLN
INTRAMUSCULAR | Status: DC | PRN
  Administered 2014-10-31: 4 mg via INTRAVENOUS

## 2014-10-31 MED ORDER — ACETAMINOPHEN 500 MG PO TABS
1000.0000 mg | ORAL_TABLET | Freq: Four times a day (QID) | ORAL | Status: AC
  Administered 2014-11-01 (×2): 1000 mg via ORAL
  Filled 2014-10-31 (×2): qty 2

## 2014-10-31 MED ORDER — DIPHENHYDRAMINE HCL 25 MG PO CAPS: 25.0000 mg | ORAL_CAPSULE | Freq: Four times a day (QID) | ORAL | Status: DC | PRN

## 2014-10-31 MED ORDER — PHENYLEPHRINE 8 MG IN D5W 100 ML (0.08MG/ML) PREMIX OPTIME
INJECTION | INTRAVENOUS | Status: AC
Start: 2014-10-31 — End: 2014-10-31
  Filled 2014-10-31: qty 100

## 2014-10-31 MED ORDER — MEPERIDINE HCL 25 MG/ML IJ SOLN: 6.2500 mg | INTRAMUSCULAR | Status: DC | PRN

## 2014-10-31 MED ORDER — SCOPOLAMINE 1 MG/3DAYS TD PT72
1.0000 | MEDICATED_PATCH | Freq: Once | TRANSDERMAL | Status: DC
Start: 2014-10-31 — End: 2014-10-31

## 2014-10-31 MED ORDER — ONDANSETRON HCL 4 MG/2ML IJ SOLN: 4.0000 mg | Freq: Three times a day (TID) | INTRAMUSCULAR | Status: DC | PRN

## 2014-10-31 MED ORDER — OXYCODONE-ACETAMINOPHEN 5-325 MG PO TABS: 1.0000 | ORAL_TABLET | ORAL | Status: DC | PRN

## 2014-10-31 MED ORDER — NALBUPHINE HCL 10 MG/ML IJ SOLN: 5.0000 mg | INTRAMUSCULAR | Status: DC | PRN

## 2014-10-31 MED ORDER — ERYTHROMYCIN 5 MG/GM OP OINT
TOPICAL_OINTMENT | OPHTHALMIC | Status: AC
  Filled 2014-10-31: qty 1

## 2014-10-31 MED ORDER — SCOPOLAMINE 1 MG/3DAYS TD PT72
MEDICATED_PATCH | TRANSDERMAL | Status: AC
  Administered 2014-10-31: 1.5 mg via TRANSDERMAL
  Filled 2014-10-31: qty 1

## 2014-10-31 MED ORDER — WITCH HAZEL-GLYCERIN EX PADS: 1.0000 "application " | MEDICATED_PAD | CUTANEOUS | Status: DC | PRN

## 2014-10-31 MED ORDER — SIMETHICONE 80 MG PO CHEW
80.0000 mg | CHEWABLE_TABLET | ORAL | Status: DC | PRN
  Administered 2014-10-31: 80 mg via ORAL

## 2014-10-31 MED ORDER — IBUPROFEN 600 MG PO TABS
600.0000 mg | ORAL_TABLET | Freq: Four times a day (QID) | ORAL | Status: DC
  Administered 2014-10-31 – 2014-11-02 (×7): 600 mg via ORAL
  Filled 2014-10-31 (×7): qty 1

## 2014-10-31 MED ORDER — MORPHINE SULFATE (PF) 0.5 MG/ML IJ SOLN
INTRAMUSCULAR | Status: DC | PRN
  Administered 2014-10-31: .15 mg via INTRATHECAL

## 2014-10-31 MED ORDER — MORPHINE SULFATE 0.5 MG/ML IJ SOLN
INTRAMUSCULAR | Status: AC
  Filled 2014-10-31: qty 10

## 2014-10-31 MED ORDER — NALOXONE HCL 0.4 MG/ML IJ SOLN: 0.4000 mg | INTRAMUSCULAR | Status: DC | PRN

## 2014-10-31 MED ORDER — SENNOSIDES-DOCUSATE SODIUM 8.6-50 MG PO TABS
2.0000 | ORAL_TABLET | ORAL | Status: DC
  Administered 2014-11-01 – 2014-11-02 (×2): 2 via ORAL
  Filled 2014-10-31 (×2): qty 2

## 2014-10-31 MED ORDER — NALBUPHINE HCL 10 MG/ML IJ SOLN: 5.0000 mg | Freq: Once | INTRAMUSCULAR | Status: AC | PRN

## 2014-10-31 MED ORDER — DIBUCAINE 1 % RE OINT: 1.0000 "application " | TOPICAL_OINTMENT | RECTAL | Status: DC | PRN

## 2014-10-31 MED ORDER — BUPIVACAINE IN DEXTROSE 0.75-8.25 % IT SOLN
INTRATHECAL | Status: DC | PRN
  Administered 2014-10-31: 1.5 mL via INTRATHECAL

## 2014-10-31 MED ORDER — LACTATED RINGERS IV SOLN
INTRAVENOUS | Status: DC
  Administered 2014-10-31: 20:00:00 via INTRAVENOUS

## 2014-10-31 MED ORDER — PHENYLEPHRINE 8 MG IN D5W 100 ML (0.08MG/ML) PREMIX OPTIME
INJECTION | INTRAVENOUS | Status: DC | PRN
  Administered 2014-10-31: 60 ug/min via INTRAVENOUS

## 2014-10-31 MED ORDER — KETOROLAC TROMETHAMINE 30 MG/ML IJ SOLN: 30.0000 mg | Freq: Once | INTRAMUSCULAR | Status: DC | PRN

## 2014-10-31 MED ORDER — MEASLES, MUMPS & RUBELLA VAC ~~LOC~~ INJ: 0.5000 mL | INJECTION | Freq: Once | SUBCUTANEOUS | Status: DC

## 2014-10-31 MED ORDER — DIPHENHYDRAMINE HCL 25 MG PO CAPS
25.0000 mg | ORAL_CAPSULE | ORAL | Status: DC | PRN
  Administered 2014-11-01: 25 mg via ORAL
  Filled 2014-10-31: qty 1

## 2014-10-31 MED ORDER — BUPIVACAINE HCL (PF) 0.5 % IJ SOLN
INTRAMUSCULAR | Status: DC | PRN
  Administered 2014-10-31: 30 mL

## 2014-10-31 MED ORDER — PROMETHAZINE HCL 25 MG/ML IJ SOLN: 6.2500 mg | INTRAMUSCULAR | Status: DC | PRN

## 2014-10-31 MED ORDER — FENTANYL CITRATE 0.05 MG/ML IJ SOLN
INTRAMUSCULAR | Status: AC
  Filled 2014-10-31: qty 2

## 2014-10-31 MED ORDER — OXYTOCIN 40 UNITS IN LACTATED RINGERS INFUSION - SIMPLE MED: 62.5000 mL/h | INTRAVENOUS | Status: AC

## 2014-10-31 MED ORDER — KETOROLAC TROMETHAMINE 30 MG/ML IJ SOLN
30.0000 mg | Freq: Four times a day (QID) | INTRAMUSCULAR | Status: AC | PRN
  Administered 2014-10-31: 30 mg via INTRAMUSCULAR

## 2014-10-31 MED ORDER — SIMETHICONE 80 MG PO CHEW
80.0000 mg | CHEWABLE_TABLET | ORAL | Status: DC
  Administered 2014-11-01 – 2014-11-02 (×2): 80 mg via ORAL
  Filled 2014-10-31 (×2): qty 1

## 2014-10-31 MED ORDER — ONDANSETRON HCL 4 MG/2ML IJ SOLN
INTRAMUSCULAR | Status: AC
  Filled 2014-10-31: qty 2

## 2014-10-31 MED ORDER — KETOROLAC TROMETHAMINE 30 MG/ML IJ SOLN
INTRAMUSCULAR | Status: AC
  Administered 2014-10-31: 30 mg via INTRAMUSCULAR
  Filled 2014-10-31: qty 1

## 2014-10-31 MED ORDER — OXYTOCIN 10 UNIT/ML IJ SOLN
INTRAMUSCULAR | Status: AC
  Filled 2014-10-31: qty 4

## 2014-10-31 MED ORDER — NALOXONE HCL 1 MG/ML IJ SOLN
1.0000 ug/kg/h | INTRAVENOUS | Status: DC | PRN
  Filled 2014-10-31: qty 2

## 2014-10-31 MED ORDER — LACTATED RINGERS IV SOLN
INTRAVENOUS | Status: DC
  Administered 2014-10-31: 10:00:00 via INTRAVENOUS

## 2014-10-31 MED ORDER — DIPHENHYDRAMINE HCL 50 MG/ML IJ SOLN: 12.5000 mg | INTRAMUSCULAR | Status: DC | PRN

## 2014-10-31 MED ORDER — LACTATED RINGERS IV SOLN
INTRAVENOUS | Status: DC
  Administered 2014-10-31: 11:00:00 via INTRAVENOUS

## 2014-10-31 MED ORDER — ACETAMINOPHEN 325 MG PO TABS: 650.0000 mg | ORAL_TABLET | ORAL | Status: DC | PRN

## 2014-10-31 MED ORDER — KETOROLAC TROMETHAMINE 30 MG/ML IJ SOLN: 30.0000 mg | Freq: Four times a day (QID) | INTRAMUSCULAR | Status: AC | PRN

## 2014-10-31 MED ORDER — OXYCODONE-ACETAMINOPHEN 5-325 MG PO TABS: 2.0000 | ORAL_TABLET | ORAL | Status: DC | PRN

## 2014-10-31 MED ORDER — ZOLPIDEM TARTRATE 5 MG PO TABS: 5.0000 mg | ORAL_TABLET | Freq: Every evening | ORAL | Status: DC | PRN

## 2014-10-31 MED ORDER — TETANUS-DIPHTH-ACELL PERTUSSIS 5-2.5-18.5 LF-MCG/0.5 IM SUSP: 0.5000 mL | Freq: Once | INTRAMUSCULAR | Status: DC

## 2014-10-31 MED ORDER — LACTATED RINGERS IV SOLN
Freq: Once | INTRAVENOUS | Status: AC
  Administered 2014-10-31 (×3): via INTRAVENOUS

## 2014-10-31 MED ORDER — SODIUM CHLORIDE 0.9 % IJ SOLN: 3.0000 mL | INTRAMUSCULAR | Status: DC | PRN

## 2014-10-31 MED ORDER — FENTANYL CITRATE 0.05 MG/ML IJ SOLN
INTRAMUSCULAR | Status: DC | PRN
  Administered 2014-10-31: 25 ug via INTRATHECAL

## 2014-10-31 MED ORDER — SCOPOLAMINE 1 MG/3DAYS TD PT72
1.0000 | MEDICATED_PATCH | Freq: Once | TRANSDERMAL | Status: DC
  Administered 2014-10-31: 1.5 mg via TRANSDERMAL

## 2014-10-31 MED ORDER — MENTHOL 3 MG MT LOZG: 1.0000 | LOZENGE | OROMUCOSAL | Status: DC | PRN

## 2014-10-31 MED ORDER — PRENATAL MULTIVITAMIN CH
1.0000 | ORAL_TABLET | Freq: Every day | ORAL | Status: DC
  Administered 2014-11-01: 1 via ORAL
  Filled 2014-10-31: qty 1

## 2014-10-31 MED ORDER — MAGNESIUM HYDROXIDE 400 MG/5ML PO SUSP: 30.0000 mL | ORAL | Status: DC | PRN

## 2014-10-31 MED ORDER — SODIUM CHLORIDE 0.9 % IR SOLN
Status: DC | PRN
  Administered 2014-10-31: 1000 mL

## 2014-10-31 MED ORDER — OXYTOCIN 10 UNIT/ML IJ SOLN
40.0000 [IU] | INTRAVENOUS | Status: DC | PRN
  Administered 2014-10-31: 40 [IU] via INTRAVENOUS

## 2014-10-31 MED ORDER — FENTANYL CITRATE 0.05 MG/ML IJ SOLN: 25.0000 ug | INTRAMUSCULAR | Status: DC | PRN

## 2014-10-31 MED ORDER — LANOLIN HYDROUS EX OINT: 1.0000 "application " | TOPICAL_OINTMENT | CUTANEOUS | Status: DC | PRN

## 2014-10-31 SURGICAL SUPPLY — 45 items
BENZOIN TINCTURE PRP APPL 2/3 (GAUZE/BANDAGES/DRESSINGS) ×3 IMPLANT
CLAMP CORD UMBIL (MISCELLANEOUS) IMPLANT
CLOSURE WOUND 1/2 X4 (GAUZE/BANDAGES/DRESSINGS) ×2
CLOTH BEACON ORANGE TIMEOUT ST (SAFETY) ×3 IMPLANT
DRAPE SHEET LG 3/4 BI-LAMINATE (DRAPES) IMPLANT
DRSG OPSITE POSTOP 4X10 (GAUZE/BANDAGES/DRESSINGS) ×3 IMPLANT
DRSG PAD ABDOMINAL 8X10 ST (GAUZE/BANDAGES/DRESSINGS) ×3 IMPLANT
DURAPREP 26ML APPLICATOR (WOUND CARE) ×3 IMPLANT
ELECT REM PT RETURN 9FT ADLT (ELECTROSURGICAL) ×3
ELECTRODE REM PT RTRN 9FT ADLT (ELECTROSURGICAL) ×1 IMPLANT
EXTRACTOR VACUUM M CUP 4 TUBE (SUCTIONS) IMPLANT
EXTRACTOR VACUUM M CUP 4' TUBE (SUCTIONS)
GLOVE BIO SURGEON STRL SZ 6 (GLOVE) ×3 IMPLANT
GLOVE BIOGEL PI IND STRL 6.5 (GLOVE) ×1 IMPLANT
GLOVE BIOGEL PI IND STRL 7.0 (GLOVE) ×2 IMPLANT
GLOVE BIOGEL PI INDICATOR 6.5 (GLOVE) ×2
GLOVE BIOGEL PI INDICATOR 7.0 (GLOVE) ×4
GLOVE ECLIPSE 7.0 STRL STRAW (GLOVE) ×3 IMPLANT
GLOVE INDICATOR 7.0 STRL GRN (GLOVE) ×9 IMPLANT
GLOVE SURG SS PI 7.0 STRL IVOR (GLOVE) ×3 IMPLANT
GOWN STRL REUS W/TWL LRG LVL3 (GOWN DISPOSABLE) ×12 IMPLANT
KIT ABG SYR 3ML LUER SLIP (SYRINGE) IMPLANT
NEEDLE HYPO 22GX1.5 SAFETY (NEEDLE) ×3 IMPLANT
NEEDLE HYPO 25X5/8 SAFETYGLIDE (NEEDLE) ×3 IMPLANT
NS IRRIG 1000ML POUR BTL (IV SOLUTION) ×3 IMPLANT
PACK C SECTION WH (CUSTOM PROCEDURE TRAY) ×3 IMPLANT
PAD ABD 7.5X8 STRL (GAUZE/BANDAGES/DRESSINGS) ×3 IMPLANT
PAD ABD 8X7 1/2 STERILE (GAUZE/BANDAGES/DRESSINGS) ×3 IMPLANT
PAD OB MATERNITY 4.3X12.25 (PERSONAL CARE ITEMS) ×3 IMPLANT
RETRACTOR WND ALEXIS 25 LRG (MISCELLANEOUS) ×1 IMPLANT
RTRCTR C-SECT PINK 25CM LRG (MISCELLANEOUS) IMPLANT
RTRCTR WOUND ALEXIS 25CM LRG (MISCELLANEOUS) ×3
STAPLER VISISTAT 35W (STAPLE) IMPLANT
STRIP CLOSURE SKIN 1/2X4 (GAUZE/BANDAGES/DRESSINGS) ×4 IMPLANT
SUT PDS AB 0 CT1 27 (SUTURE) IMPLANT
SUT PDS AB 0 CTX 36 PDP370T (SUTURE) ×3 IMPLANT
SUT PDS AB 0 CTX 60 (SUTURE) ×3 IMPLANT
SUT PLAIN 2 0 XLH (SUTURE) ×3 IMPLANT
SUT VIC AB 0 CT1 36 (SUTURE) ×6 IMPLANT
SUT VIC AB 0 CTX 36 (SUTURE) ×4
SUT VIC AB 0 CTX36XBRD ANBCTRL (SUTURE) ×2 IMPLANT
SUT VIC AB 4-0 KS 27 (SUTURE) ×3 IMPLANT
SYR CONTROL 10ML LL (SYRINGE) ×3 IMPLANT
TOWEL OR 17X24 6PK STRL BLUE (TOWEL DISPOSABLE) ×3 IMPLANT
TRAY FOLEY CATH SILVER 14FR (SET/KITS/TRAYS/PACK) ×3 IMPLANT

## 2014-10-31 NOTE — Lactation Note (Signed)
This note was copied from the chart of [name]. Lactation Consultation Note  Patient Name: [name] ZOXWR'UToday's Date: 10/31/2014 Reason for consult: Initial assessment of this mom and baby at 6 hours pp.  This is mom's second child.  Older child is 1818 months old and mom says she breastfed 11 1/2 months.  Per mom, her newborn is latching well.  Multiple feedings have been documented and initial LATCH score=8 per RN assessment.  LC encouraged frequent STS and cue feedings.  LC cautioned mom to avoid pacifiers and bottles for a minimum of 2 weeks while baby is learning to breastfeed.  Mom encouraged to feed baby 8-12 times/24 hours and with feeding cues. LC encouraged review of Baby and Me pp 9, 14 and 20-25 for STS and BF information. LC provided [employer] Resource brochure and reviewed [facility] and list of community and web site resources.    Maternal Data Formula Feeding for Exclusion: No Has patient been taught Hand Expression?: Yes (LC demonstrated technique) Does the patient have breastfeeding experience prior to this delivery?: Yes  Feeding Feeding Type: Breast Fed Length of feed: 60 min  LATCH Score/Interventions           initial LATCH score=8 per RN assessment           Lactation Tools Discussed/Used   STS, cue feedings, hand expression Avoiding pacifiers and bottles for a minimum of 2 weeks while establishing breastfeeding  Consult Status Consult Status: PRN Date: 11/01/14 Follow-up type: In-patient    Brittany Livingston, Brittany Livingston [facility] 10/31/2014, 5:23 PM

## 2014-10-31 NOTE — Anesthesia Preprocedure Evaluation (Addendum)
Anesthesia Evaluation  Patient identified by MRN, date of birth, ID band Patient awake and Patient confused    Reviewed: Allergy & Precautions, H&P , NPO status , Patient's Chart, lab work & pertinent test results  Airway Mallampati: III       Dental   Pulmonary  breath sounds clear to auscultation  Pulmonary exam normal       Cardiovascular Exercise Tolerance: Good Rhythm:regular Rate:Normal     Neuro/Psych    GI/Hepatic   Endo/Other  diabetesMorbid obesity  Renal/GU      Musculoskeletal   Abdominal Normal abdominal exam  (+)   Peds  Hematology   Anesthesia Other Findings   Reproductive/Obstetrics (+) Pregnancy                           Anesthesia Physical Anesthesia Plan  ASA: III  Anesthesia Plan: Spinal   Post-op Pain Management:    Induction:   Airway Management Planned:   Additional Equipment:   Intra-op Plan:   Post-operative Plan:   Informed Consent:   Plan Discussed with:   Anesthesia Plan Comments:         Anesthesia Quick Evaluation

## 2014-10-31 NOTE — Anesthesia Procedure Notes (Signed)
Spinal Patient location during procedure: OR Start time: 10/31/2014 10:12 AM Staffing Anesthesiologist: Alexis Frock Performed by: anesthesiologist  Preanesthetic Checklist Completed: patient identified, site marked, surgical consent, pre-op evaluation, timeout performed, IV checked, risks and benefits discussed and monitors and equipment checked Spinal Block Patient position: sitting Prep: DuraPrep Patient monitoring: heart rate, continuous pulse ox and blood pressure Location: L3-4 Injection technique: single-shot Needle Needle type: Spinocan  Needle gauge: 24 G Needle length: 9 cm Additional Notes Expiration date of kit checked and confirmed. Patient tolerated procedure well, without complications.

## 2014-10-31 NOTE — Op Note (Signed)
Brittany Raohyllis Mckone PROCEDURE DATE: 10/31/2014  PREOPERATIVE DIAGNOSES: Intrauterine pregnancy at 8432w0d weeks gestation; previous cesarean section; declines TOLAC; Glyburide-controlled GDM   POSTOPERATIVE DIAGNOSES: The same  PROCEDURE: Repeat Low Transverse Cesarean Section  SURGEON:  Dr. Jaynie CollinsUgonna Anyanwu  ASSISTANT:  Kandis FantasiaSenmiao Zhan, MS3  ANESTHESIOLOGIST: Dr. Brayton CavesFreeman Jackson  INDICATIONS: Brittany Livingston is a 29 y.o. [id number] at 2832w0d here for cesarean section secondary to the indications listed under preoperative diagnoses; please see preoperative note for further details.  The risks of cesarean section were discussed with the patient including but were not limited to: bleeding which may require transfusion or reoperation; infection which may require antibiotics; injury to bowel, bladder, ureters or other surrounding organs; injury to the fetus; need for additional procedures including hysterectomy in the event of a life-threatening hemorrhage; placental abnormalities wth subsequent pregnancies, incisional problems, thromboembolic phenomenon and other postoperative/anesthesia complications.   The patient concurred with the proposed plan, giving informed written consent for the procedure.    FINDINGS:  Viable female infant in cephalic presentation.  Apgars 7 and 9.  Clear amniotic fluid.  Intact placenta, three vessel cord.  Normal uterus, fallopian tubes and ovaries bilaterally. Minimal adhesive disease noted.  ANESTHESIA: Spinal INTRAVENOUS FLUIDS: 2500 ml ESTIMATED BLOOD LOSS: 700 ml URINE OUTPUT:  125 ml SPECIMENS: Placenta sent to L&D COMPLICATIONS: None immediate  PROCEDURE IN DETAIL:  The patient preoperatively received intravenous antibiotics and had sequential compression devices applied to her lower extremities.  She was then taken to the operating room where spinal anesthesia was administered and was found to be adequate. She was then placed in a dorsal supine position with a leftward tilt,  and prepped and draped in a sterile manner.  A foley catheter was placed into her bladder and attached to constant gravity.  After an adequate timeout was performed, a Pfannenstiel skin incision was made with scalpel over her preexisting scar and carried through to the underlying layer of fascia. The fascia was incised in the midline, and this incision was extended bilaterally using the Mayo scissors.  Kocher clamps were applied to the superior aspect of the fascial incision and the underlying rectus muscles were dissected off sharply. A similar process was carried out on the inferior aspect of the fascial incision. The rectus muscles were separated in the midline sharply and the peritoneum was entered sharply. Attention was turned to the lower uterine segment where a low transverse hysterotomy was made with a scalpel and extended bilaterally bluntly.  The infant was successfully delivered, the cord was clamped and cut and the infant was handed over to awaiting neonatology team. Uterine massage was then administered, and the placenta delivered intact with a three-vessel cord. The uterus was then cleared of clot and debris.  The hysterotomy was closed with 0 Vicryl in a running locked fashion, and an imbricating layer was also placed with 0 Vicryl. The pelvis was cleared of all clot and debris. Hemostasis was confirmed on all surfaces.   The fascia was then closed using 0 PDS in a running fashion.  The subcutaneous layer was irrigated, then reapproximated with 2-0 plain gut interrupted stitches, and 30 ml of 0.5% Marcaine was injected subcutaneously around the incision.  The skin was closed with a 4-0 Vicryl subcuticular stitch. The patient tolerated the procedure well. Sponge, lap, instrument and needle counts were correct x 2.  She was taken to the recovery room in stable condition.    Jaynie CollinsUGONNA  ANYANWU, MD, FACOG Attending Obstetrician & Gynecologist Faculty Practice, [facility]  [place]

## 2014-10-31 NOTE — Anesthesia Postprocedure Evaluation (Signed)
  Anesthesia Post-op Note  Patient: Brittany RaoPhyllis Cleland  Procedure(s) Performed: Procedure(s): CESAREAN SECTION  (N/A)  Patient Location: Mother/Baby  Anesthesia Type:Spinal  Level of Consciousness: awake, alert  and oriented  Airway and Oxygen Therapy: Patient Spontanous Breathing  Post-op Pain: mild  Post-op Assessment: Post-op Vital signs reviewed  Post-op Vital Signs: Reviewed and stable  Last Vitals:  Filed Vitals:   10/31/14 1256  BP: 99/47  Pulse: 64  Temp: 36.6 C  Resp: 20    Complications: No apparent anesthesia complications

## 2014-10-31 NOTE — H&P (Signed)
Obstetric Preoperative History and Physical  Nohelani Benning is a 29 y.o. G2P1001 with IUP at [redacted]w[redacted]d presenting for presenting for scheduled repeat cesarean section.  No acute concerns.   Prenatal Course Source of Care: [facility] with onset of care at 14 weeks Pregnancy complications or risks: Patient Active Problem List   Diagnosis Date Noted  . GDM (gestational diabetes mellitus)   . Large for gestational age   . Obesity in pregnancy with antepartum complication   . S/P cesarean section 05/09/2014  . Previous cesarean delivery affecting pregnancy, antepartum 05/09/2014   She plans to breastfeed She desires Mirena IUD for postpartum contraception.   Prenatal labs and studies: ABO, Rh: --/--/O POS, O POS (03/31 1045) Antibody: NEG (03/31 1045) Rubella: 28.40 (10/09 1123) RPR: Non Reactive (03/31 1045)  HBsAg: NEGATIVE (10/09 1123)  HIV: NONREACTIVE (01/19 1020)  GBS:  1 hr Glucola 163  3 hr GTT 104/215/189 Genetic screening normal Anatomy US normal   Past Medical History  Diagnosis Date  . Diabetes mellitus during pregnancy 2016  . AS (sickle cell trait)     Past Surgical History  Procedure Laterality Date  . Cesarean section  2014    In [place]    OB History  Gravida Para Term Preterm AB SAB TAB Ectopic Multiple Living  0 0 0 0 0 0 1    # Outcome Date GA Lbr Len/2nd Weight Sex Delivery Anes PTL Lv  2 Current           1 Term 04/27/13   7 lb 4.4 oz (3.3 kg) M CS-LTranv Spinal N Y      History   Social History  . Marital Status: Married    Spouse Name: N/A  . Number of Children: N/A  . Years of Education: N/A   Social History Main Topics  . Smoking status: Never Smoker   . Smokeless tobacco: Never Used  . Alcohol Use: No  . Drug Use: No  . Sexual Activity: Not on file     Comment: pregnant   Other Topics Concern  . None   Social History Narrative    History reviewed. No pertinent family history.  Prescriptions prior to admission  Medication Sig  Dispense Refill Last Dose  . glyBURIDE (DIABETA) 2.5 MG tablet Take 2.5 mg in the morning and 5 mg at night. 60 tablet 3 10/30/2014 at Unknown time  . Prenatal Vit-Fe Fumarate-FA (PRENATAL MULTIVITAMIN) TABS tablet Take 1 tablet by mouth daily at 12 noon.   10/30/2014 at Unknown time    No Known Allergies  Review of Systems: Negative except for what is mentioned in HPI.  Physical Exam: LMP 01/31/2014  AFVSS GENERAL: Well-developed, well-nourished female in no acute distress.  LUNGS: Clear to auscultation bilaterally.  HEART: Regular rate and rhythm. ABDOMEN: Soft, nontender, nondistended, gravid, well-healed Pfannenstiel incision. PELVIC: Deferred EXTREMITIES: Nontender, no edema, 2+ distal pulses.   Pertinent Labs/Studies:   Results for orders placed or performed during the hospital encounter of 10/31/14 (from the past 72 hour(s))  Prepare RBC (crossmatch)     Status: None   Collection Time: 10/31/14  8:31 AM  Result Value Ref Range   Order Confirmation ORDER PROCESSED BY BLOOD BANK   Glucose, capillary     Status: None   Collection Time: 10/31/14  8:34 AM  Result Value Ref Range   Glucose-Capillary 82 70 - 99 mg/dL    Assessment and Plan :Khali Perella is a 29 y.o. G2P1001 at [redacted]w[redacted]d being  admitted being admitted for scheduled repeat cesarean section. The risks of cesarean section discussed with the patient included but were not limited to: bleeding which may require transfusion or reoperation; infection which may require antibiotics; injury to bowel, bladder, ureters or other surrounding organs; injury to the fetus; need for additional procedures including hysterectomy in the event of a life-threatening hemorrhage; placental abnormalities wth subsequent pregnancies, incisional problems, thromboembolic phenomenon and other postoperative/anesthesia complications. The patient concurred with the proposed plan, giving informed written consent for the procedure. Patient has been NPO since last  night she will remain NPO for procedure. Anesthesia and OR aware. Preoperative prophylactic antibiotics and SCDs ordered on call to the OR. To OR when ready.    Jaynie CollinsUGONNA ANYANWU, MD, FACOG  Attending Obstetrician & Gynecologist  Faculty Practice, [facility]

## 2014-10-31 NOTE — Transfer of Care (Signed)
Immediate Anesthesia Transfer of Care Note  Patient: Brittany Livingston  Procedure(s) Performed: Procedure(s): CESAREAN SECTION  (N/A)  Patient Location: PACU  Anesthesia Type:Spinal  Level of Consciousness: awake, alert  and oriented  Airway & Oxygen Therapy: Patient Spontanous Breathing  Post-op Assessment: Report given to RN and Post -op Vital signs reviewed and stable  Post vital signs: Reviewed and stable  Last Vitals: There were no vitals filed for this visit.  Complications: No apparent anesthesia complications

## 2014-10-31 NOTE — Anesthesia Postprocedure Evaluation (Signed)
  Anesthesia Post-op Note  Patient: Brittany RaoPhyllis Livingston  Procedure(s) Performed: Procedure(s): CESAREAN SECTION  (N/A)  Patient Location: PACU  Anesthesia Type:Spinal  Level of Consciousness: awake and alert   Airway and Oxygen Therapy: Patient Spontanous Breathing  Post-op Pain: mild  Post-op Assessment: Post-op Vital signs reviewed and Patient's Cardiovascular Status Stable  Post-op Vital Signs: Reviewed and stable  Last Vitals:  Filed Vitals:   10/31/14 1122  BP: 107/44  Pulse: 74  Temp: 36.7 C  Resp: 18    Complications: No apparent anesthesia complications

## 2014-10-31 NOTE — Anesthesia Preprocedure Evaluation (Deleted)
Anesthesia Evaluation  Patient identified by MRN, date of birth, ID band Patient awake and Patient confused    Reviewed: Allergy & Precautions, H&P , NPO status , Patient's Chart, lab work & pertinent test results  Airway Mallampati: II       Dental   Pulmonary  breath sounds clear to auscultation  Pulmonary exam normal       Cardiovascular Exercise Tolerance: Good Rhythm:regular Rate:Normal     Neuro/Psych    GI/Hepatic   Endo/Other    Renal/GU      Musculoskeletal   Abdominal Normal abdominal exam  (+)   Peds  Hematology   Anesthesia Other Findings   Reproductive/Obstetrics (+) Pregnancy                           Anesthesia Physical Anesthesia Plan  ASA: II  Anesthesia Plan: Epidural   Post-op Pain Management:    Induction:   Airway Management Planned:   Additional Equipment:   Intra-op Plan:   Post-operative Plan:   Informed Consent:   Plan Discussed with:   Anesthesia Plan Comments:         Anesthesia Quick Evaluation

## 2014-10-31 NOTE — Anesthesia Preprocedure Evaluation (Deleted)
Anesthesia Evaluation  Patient identified by MRN, date of birth, ID band Patient awake and Patient confused    Reviewed: Allergy & Precautions, H&P , NPO status , Patient's Chart, lab work & pertinent test results  Airway Mallampati: II       Dental   Pulmonary  breath sounds clear to auscultation  Pulmonary exam normal       Cardiovascular Exercise Tolerance: Good Rhythm:regular Rate:Normal     Neuro/Psych    GI/Hepatic   Endo/Other  diabetes  Renal/GU      Musculoskeletal   Abdominal Normal abdominal exam  (+)   Peds  Hematology   Anesthesia Other Findings   Reproductive/Obstetrics (+) Pregnancy                             Anesthesia Physical Anesthesia Plan Anesthesia Quick Evaluation

## 2014-11-01 LAB — CBC
HCT: 32.3 % — ABNORMAL LOW (ref 36.0–46.0)
Hemoglobin: 10.5 g/dL — ABNORMAL LOW (ref 12.0–15.0)
MCH: 24.6 pg — AB (ref 26.0–34.0)
MCHC: 32.5 g/dL (ref 30.0–36.0)
MCV: 75.6 fL — ABNORMAL LOW (ref 78.0–100.0)
PLATELETS: 247 10*3/uL (ref 150–400)
RBC: 4.27 MIL/uL (ref 3.87–5.11)
RDW: 15.3 % (ref 11.5–15.5)
WBC: 10.1 10*3/uL (ref 4.0–10.5)

## 2014-11-01 LAB — GLUCOSE, CAPILLARY: GLUCOSE-CAPILLARY: 94 mg/dL (ref 70–99)

## 2014-11-01 NOTE — Progress Notes (Signed)
Subjective: Postpartum Day 1: Cesarean Delivery Patient reports tolerating PO, + flatus and no problems voiding.    Objective: Vital signs in last 24 hours: Temp:  [97.4 F (36.3 C)-98.4 F (36.9 C)] 98.4 F (36.9 C) (04/02 0423) Pulse Rate:  [59-88] 75 (04/02 0423) Resp:  [17-20] 18 (04/02 0423) BP: (97-117)/(44-65) 117/57 mmHg (04/02 0423) SpO2:  [97 %-100 %] 98 % (04/02 0423)  Physical Exam:  General: alert, cooperative and no distress Lochia: appropriate Uterine Fundus: firm Incision: no significant drainage, no significant erythema, pressure dressing in place DVT Evaluation: No evidence of DVT seen on physical exam. No cords or calf tenderness. No significant calf/ankle edema.   Recent Labs  10/30/14 1045 11/01/14 0543  HGB 11.1* 10.5*  HCT 34.4* 32.3*    Assessment/Plan: Status post Cesarean section. Doing well postoperatively. No significant drop in hemoglobin. Has been up moving around without difficulty. Continue current care. Plan for discharge tomorrow.  Beverely Lowdamo, Elena 11/01/2014, 7:59 AM  I have seen and examined this patient and I agree with the above. Breastfeeding and desires Mirena for contraception. Cam HaiSHAW, Aryanna Shaver CNM 8:54 AM 11/01/2014

## 2014-11-02 MED ORDER — OXYCODONE-ACETAMINOPHEN 5-325 MG PO TABS: 1.0000 | ORAL_TABLET | ORAL | Status: DC | PRN

## 2014-11-02 MED ORDER — IBUPROFEN 600 MG PO TABS: 600.0000 mg | ORAL_TABLET | Freq: Four times a day (QID) | ORAL | Status: DC

## 2014-11-02 NOTE — Discharge Summary (Signed)
Obstetric Discharge Summary Reason for Admission: cesarean section Prenatal Procedures: none Intrapartum Procedures: cesarean: low cervical, transverse Postpartum Procedures: none Complications-Operative and Postpartum: none HEMOGLOBIN  Date Value Ref Range Status  11/01/2014 10.5* 12.0 - 15.0 g/dL Final   HCT  Date Value Ref Range Status  11/01/2014 32.3* 36.0 - 46.0 % Final  Hospital Course: Brittany Livingston is a 29 y.o. G2P1001 with IUP at 6061w0d presenting for presenting for scheduled repeat cesarean section. No acute concerns PROCEDURE DATE: 10/31/2014  PREOPERATIVE DIAGNOSES: Intrauterine pregnancy at 3861w0d weeks gestation; previous cesarean section; declines TOLAC; Glyburide-controlled GDM   POSTOPERATIVE DIAGNOSES: The same  PROCEDURE: Repeat Low Transverse Cesarean Section  SURGEON: Dr. Jaynie CollinsUgonna Anyanwu  ASSISTANT: Kandis FantasiaSenmiao Zhan, MS3  ANESTHESIOLOGIST: Dr. Brayton CavesFreeman Jackson  INDICATIONS: Brittany Raohyllis Spoelstra is a 29 y.o. [id number] at 4061w0d here for cesarean section secondary to the indications listed under preoperative diagnoses; please see preoperative note for further details. The risks of cesarean section were discussed with the patient including but were not limited to: bleeding which may require transfusion or reoperation; infection which may require antibiotics; injury to bowel, bladder, ureters or other surrounding organs; injury to the fetus; need for additional procedures including hysterectomy in the event of a life-threatening hemorrhage; placental abnormalities wth subsequent pregnancies, incisional problems, thromboembolic phenomenon and other postoperative/anesthesia complications. The patient concurred with the proposed plan, giving informed written consent for the procedure.   FINDINGS: Viable female infant in cephalic presentation. Apgars 7 and 9. Clear amniotic fluid. Intact placenta, three vessel cord. Normal uterus, fallopian tubes and ovaries bilaterally.  Minimal adhesive disease noted.  ANESTHESIA: Spinal INTRAVENOUS FLUIDS: 2500 ml ESTIMATED BLOOD LOSS: 700 ml URINE OUTPUT: 125 ml SPECIMENS: Placenta sent to L&D COMPLICATIONS: None immediate  She has done well postoperatively. She is tolerating food and fluids. She is ambulating without problems, She is passing flatus. She is breastfeeding well. She is requesting early discharge. She is deemed ready for discharge.  Physical Exam:  General: alert, cooperative and no distress Lochia: appropriate Uterine Fundus: firm Incision: healing well, no significant drainage DVT Evaluation: No evidence of DVT seen on physical exam.  Discharge Diagnoses: Term Pregnancy-delivered  Discharge Information: Date: 11/02/2014 Activity: unrestricted and pelvic rest Diet: routine Medications: PNV, Ibuprofen and Percocet Condition: stable and improved Instructions: refer to practice specific booklet Discharge to: home Follow-up Information    [facility]. Schedule an appointment as soon as possible for a visit in 4 weeks.   Specialty:  Obstetrics and Gynecology   Contact information:   [address] [place] [phone]      Newborn Data: Live born female  Birth Weight: 7 lb 3.9 oz (3285 g) APGAR: 7, 9  Home with mother.  [facility]ILLIAMS,Marialice Newkirk 11/02/2014, 6:48 AM

## 2014-11-02 NOTE — Discharge Instructions (Signed)

## 2014-11-03 ENCOUNTER — Encounter (HOSPITAL_COMMUNITY): Payer: Self-pay | Admitting: Obstetrics & Gynecology

## 2014-11-03 LAB — TYPE AND SCREEN
ABO/RH(D): O POS
Antibody Screen: NEGATIVE
UNIT DIVISION: 0
Unit division: 0
Unit division: 0
Unit division: 0

## 2014-11-19 ENCOUNTER — Encounter: Payer: Self-pay | Admitting: Obstetrics & Gynecology

## 2014-12-12 ENCOUNTER — Ambulatory Visit (INDEPENDENT_AMBULATORY_CARE_PROVIDER_SITE_OTHER): Payer: Self-pay | Admitting: Family Medicine

## 2014-12-12 ENCOUNTER — Telehealth: Payer: Self-pay | Admitting: General Practice

## 2014-12-12 ENCOUNTER — Encounter: Payer: Self-pay | Admitting: Family Medicine

## 2014-12-12 NOTE — Progress Notes (Signed)
mirena application filled out. Patient to bring back proof of income

## 2014-12-12 NOTE — Progress Notes (Signed)
Patient ID: Brittany Raohyllis Bieser, female   DOB: 04/04/1986, 29 y.o.   MRN: [id number] Subjective:     Brittany Raohyllis Voit is a 29 y.o. female who presents for a postpartum visit. She is 6 weeks postpartum following a low cervical transverse Cesarean section. I have fully reviewed the prenatal and intrapartum course. The delivery was at 39 gestational weeks. Outcome: repeat cesarean section, low transverse incision. Anesthesia: spinal. Postpartum course has been unremarkable. Baby's course has been unremarkable. Baby is feeding by breast. Bleeding staining only. Bowel function is normal. Bladder function is normal. Patient is not sexually active. Contraception method is IUD. Postpartum depression screening: negative.  The following portions of the patient's history were reviewed and updated as appropriate: allergies, current medications, past family history, past medical history, past social history, past surgical history and problem list.  Review of Systems Pertinent items are noted in HPI.   Objective:    BP 99/79 mmHg  Pulse 80  Temp(Src) 98.5 F (36.9 C) (Oral)  Ht 5\' 2"  (1.575 m)  Wt 194 lb 8 oz (88.225 kg)  BMI 35.57 kg/m2  Breastfeeding? Yes  General:  alert and cooperative  Lungs: normal effort, no distress  Heart:  normal rate  Abdomen: soft, non-tender; bowel sounds normal; no masses,  no organomegaly, incision clean/dry/intact, well healed        Assessment:     normal postpartum exam. Pap smear not done at today's visit.   Plan:    1. Contraception: IUD  Discussed filling out application vs having IUD placed at [facility], requests to fill out application and pay insertion fee to have placed here 2. A2DM: needs to have gtt done, will have done at time of IUD insertion.  Declines OCPs in interim, will use condoms 3. Follow up in: 2 weeks or as needed.

## 2014-12-12 NOTE — Telephone Encounter (Signed)
Patient left clinic before we could discuss need for 2 hr gtt. Called patient and discussed need for 2 hr gtt and that she will need to come in fasting. Patient verbalized understanding and states she can come next Friday 5/20 @ 9am. Patient had no questions

## 2014-12-19 ENCOUNTER — Other Ambulatory Visit: Payer: Self-pay

## 2015-04-13 ENCOUNTER — Telehealth: Payer: Self-pay | Admitting: *Deleted

## 2015-04-13 ENCOUNTER — Encounter: Payer: Self-pay | Admitting: *Deleted

## 2015-04-13 NOTE — Telephone Encounter (Signed)
Attempted to contact patient concerning expired Mirena application, no answer, at subscriber's request not accepting incoming calls/ Will send letter.

## 2015-09-30 ENCOUNTER — Ambulatory Visit: Payer: Self-pay | Admitting: Family Medicine

## 2016-01-23 ENCOUNTER — Ambulatory Visit (INDEPENDENT_AMBULATORY_CARE_PROVIDER_SITE_OTHER): Payer: PRIVATE HEALTH INSURANCE | Admitting: Family Medicine

## 2016-01-23 VITALS — BP 116/74 | HR 78 | Temp 98.7°F | Resp 16 | Ht 62.0 in | Wt 222.8 lb

## 2016-01-23 DIAGNOSIS — R6883 Chills (without fever): Secondary | ICD-10-CM | POA: Diagnosis not present

## 2016-01-23 DIAGNOSIS — R51 Headache: Secondary | ICD-10-CM

## 2016-01-23 DIAGNOSIS — M791 Myalgia: Secondary | ICD-10-CM | POA: Diagnosis not present

## 2016-01-23 DIAGNOSIS — R519 Headache, unspecified: Secondary | ICD-10-CM

## 2016-01-23 DIAGNOSIS — M609 Myositis, unspecified: Secondary | ICD-10-CM | POA: Diagnosis not present

## 2016-01-23 DIAGNOSIS — IMO0001 Reserved for inherently not codable concepts without codable children: Secondary | ICD-10-CM

## 2016-01-23 DIAGNOSIS — N3001 Acute cystitis with hematuria: Secondary | ICD-10-CM | POA: Diagnosis not present

## 2016-01-23 LAB — POCT CBC
GRANULOCYTE PERCENT: 41.8 % (ref 37–80)
HCT, POC: 40 % (ref 37.7–47.9)
Hemoglobin: 13.4 g/dL (ref 12.2–16.2)
LYMPH, POC: 2.1 (ref 0.6–3.4)
MCH, POC: 25.8 pg — AB (ref 27–31.2)
MCHC: 33.6 g/dL (ref 31.8–35.4)
MCV: 76.7 fL — AB (ref 80–97)
MID (CBC): 0.3 (ref 0–0.9)
MPV: 8.5 fL (ref 0–99.8)
PLATELET COUNT, POC: 216 10*3/uL (ref 142–424)
POC Granulocyte: 1.8 — AB (ref 2–6.9)
POC LYMPH PERCENT: 50.8 %L — AB (ref 10–50)
POC MID %: 7.4 %M (ref 0–12)
RBC: 5.22 M/uL (ref 4.04–5.48)
RDW, POC: 13.3 %
WBC: 4.2 10*3/uL — AB (ref 4.6–10.2)

## 2016-01-23 LAB — C-REACTIVE PROTEIN

## 2016-01-23 LAB — POCT URINALYSIS DIP (MANUAL ENTRY)
GLUCOSE UA: NEGATIVE
LEUKOCYTES UA: NEGATIVE
Nitrite, UA: POSITIVE — AB
Protein Ur, POC: 30 — AB
SPEC GRAV UA: 1.025
UROBILINOGEN UA: 1
pH, UA: 5.5

## 2016-01-23 LAB — TSH: TSH: 1.42 mIU/L

## 2016-01-23 LAB — SEDIMENTATION RATE: SED RATE: 11 mm/h (ref 0–20)

## 2016-01-23 LAB — COMPREHENSIVE METABOLIC PANEL
ALK PHOS: 51 U/L (ref 33–115)
ALT: 11 U/L (ref 6–29)
AST: 16 U/L (ref 10–30)
Albumin: 4.6 g/dL (ref 3.6–5.1)
BUN: 10 mg/dL (ref 7–25)
CO2: 27 mmol/L (ref 20–31)
CREATININE: 1.02 mg/dL (ref 0.50–1.10)
Calcium: 9.1 mg/dL (ref 8.6–10.2)
Chloride: 104 mmol/L (ref 98–110)
GLUCOSE: 82 mg/dL (ref 65–99)
POTASSIUM: 4.6 mmol/L (ref 3.5–5.3)
Sodium: 140 mmol/L (ref 135–146)
Total Bilirubin: 0.7 mg/dL (ref 0.2–1.2)
Total Protein: 7.8 g/dL (ref 6.1–8.1)

## 2016-01-23 LAB — POC MICROSCOPIC URINALYSIS (UMFC)

## 2016-01-23 LAB — CK: Total CK: 143 U/L (ref 7–177)

## 2016-01-23 MED ORDER — AMOXICILLIN-POT CLAVULANATE 875-125 MG PO TABS: 1.0000 | ORAL_TABLET | Freq: Two times a day (BID) | ORAL | Status: AC

## 2016-01-23 NOTE — Patient Instructions (Addendum)
   IF you received an x-ray today, you will receive an invoice from [facility]. Please contact [facility] at [phone] with questions or concerns regarding your invoice.   IF you received labwork today, you will receive an invoice from [facility]. Please contact [facility] at [phone] with questions or concerns regarding your invoice.   Our billing staff will not be able to assist you with questions regarding bills from these companies.  You will be contacted with the lab results as soon as they are available. The fastest way to get your results is to activate your My Chart account. Instructions are located on the last page of this paperwork. If you have not heard from us regarding the results in 2 weeks, please contact this office.     Urinary Tract Infection Urinary tract infections (UTIs) can develop anywhere along your urinary tract. Your urinary tract is your body's drainage system for removing wastes and extra water. Your urinary tract includes two kidneys, two ureters, a bladder, and a urethra. Your kidneys are a pair of bean-shaped organs. Each kidney is about the size of your fist. They are located below your ribs, one on each side of your spine. CAUSES Infections are caused by microbes, which are microscopic organisms, including fungi, viruses, and bacteria. These organisms are so small that they can only be seen through a microscope. Bacteria are the microbes that most commonly cause UTIs. SYMPTOMS  Symptoms of UTIs may vary by age and gender of the patient and by the location of the infection. Symptoms in young women typically include a frequent and intense urge to urinate and a painful, burning feeling in the bladder or urethra during urination. Older women and men are more likely to be tired, shaky, and weak and have muscle aches and abdominal pain. A fever may mean the infection is in your kidneys. Other symptoms of a kidney  infection include pain in your back or sides below the ribs, nausea, and vomiting. DIAGNOSIS To diagnose a UTI, your caregiver will ask you about your symptoms. Your caregiver will also ask you to provide a urine sample. The urine sample will be tested for bacteria and white blood cells. White blood cells are made by your body to help fight infection. TREATMENT  Typically, UTIs can be treated with medication. Because most UTIs are caused by a bacterial infection, they usually can be treated with the use of antibiotics. The choice of antibiotic and length of treatment depend on your symptoms and the type of bacteria causing your infection. HOME CARE INSTRUCTIONS  If you were prescribed antibiotics, take them exactly as your caregiver instructs you. Finish the medication even if you feel better after you have only taken some of the medication.  Drink enough water and fluids to keep your urine clear or pale yellow.  Avoid caffeine, tea, and carbonated beverages. They tend to irritate your bladder.  Empty your bladder often. Avoid holding urine for long periods of time.  Empty your bladder before and after sexual intercourse.  After a bowel movement, women should cleanse from front to back. Use each tissue only once. SEEK MEDICAL CARE IF:   You have back pain.  You develop a fever.  Your symptoms do not begin to resolve within 3 days. SEEK IMMEDIATE MEDICAL CARE IF:   You have severe back pain or lower abdominal pain.  You develop chills.  You have nausea or vomiting.  You have continued burning or discomfort with urination.   MAKE SURE YOU:   Understand these instructions.  Will watch your condition.  Will get help right away if you are not doing well or get worse.   This information is not intended to replace advice given to you by your health care provider. Make sure you discuss any questions you have with your health care provider.   Document Released: 04/27/2005 Document  Revised: 04/08/2015 Document Reviewed: 08/26/2011 Elsevier Interactive Patient Education 2016 [employer]  

## 2016-01-23 NOTE — Progress Notes (Signed)
Subjective:    Patient ID: Brittany Livingston, female    DOB: [birth year], 30 y.o.   MRN: [id number] By signing my name below, I, Javier Docker, attest that this documentation has been prepared under the direction and in the presence of Norberto Sorenson, MD. Electronically Signed: Javier Docker, ER Scribe. 01/23/2016. 3:54 PM.  Chief Complaint  Patient presents with  . Fatigue    CHILLS AND HEADACHE X 3 DAYS   . phq-9    COMPLETED SCORE 10    HPI HPI Comments: Brittany Livingston is a 30 y.o. female who presents to [facility] complaining of chills, reduced appetite, muscle aches in arms, fevers, diaphoresis, and HA for three days. She denies rashes, recent travel, sinus pressure or pain, nausea, vomiting, sore throat, urinary sx or changes in BM. She denies having sx like this in the past. She got a flu shot this fall. She got the flu in the winter.  Her BM and urination have been normal. She has been sleeping normally, which for her is 4-5 hours per night. She wakes up when her child wakes up. Her child is one year old. She has been taking ibuprofen for her HA, which gives her temporary relief.   Past Medical History  Diagnosis Date  . Diabetes mellitus during pregnancy (HCC) 2016  . AS (sickle cell trait) (HCC)    No Known Allergies  No current outpatient prescriptions on file prior to visit.   No current facility-administered medications on file prior to visit.    Review of Systems  Constitutional: Positive for fever, chills and diaphoresis.  HENT: Negative for sinus pressure and sore throat.   Gastrointestinal: Negative for nausea, vomiting, diarrhea and constipation.  Genitourinary: Negative for dysuria, urgency, frequency and flank pain.  Musculoskeletal: Positive for myalgias.  Skin: Negative for rash.  Neurological: Positive for headaches.       Objective:  BP 116/74 mmHg  Pulse 78  Temp(Src) 98.7 F (37.1 C) (Oral)  Resp 16  Ht  (1.575 m)  Wt 222 lb 12.8 oz (101.061 kg)   BMI 40.74 kg/m2  SpO2 100%  LMP 01/23/2016  Physical Exam  Constitutional: She is oriented to person, place, and time. She appears well-developed and well-nourished. No distress.  HENT:  Head: Normocephalic and atraumatic.  Mouth/Throat: No oropharyngeal exudate.  TMs normal. Nares with bilateral rhinorrhea. Oropharynx with some mild erythema.   Eyes: Pupils are equal, round, and reactive to light.  Neck: Neck supple. No thyromegaly present.  Thyroid normal.   Cardiovascular: Normal rate, regular rhythm and normal heart sounds.   No murmur heard. 2+ pedal pulses.   Pulmonary/Chest: Effort normal and breath sounds normal. No respiratory distress. She has no wheezes.  Lungs clear.  Abdominal: Bowel sounds are normal.  No CVA tenderness.  Musculoskeletal: Normal range of motion.  Lymphadenopathy:    She has no cervical adenopathy.  Neurological: She is alert and oriented to person, place, and time. Coordination normal.  Skin: Skin is warm and dry. She is not diaphoretic.  Psychiatric: She has a normal mood and affect. Her behavior is normal.  Nursing note and vitals reviewed.     Assessment & Plan:   1. Chills   2. Acute nonintractable headache, unspecified headache type   3. Myalgia and myositis   4. Acute cystitis with hematuria   Pt really doesn't have any localizing sxs in her hx or exam so wide eval done and UA appears + for UTI so will cover  with augmentin. Recheck is sxs persist or worsen.  Orders Placed This Encounter  Procedures  . Urine culture  . Sedimentation Rate  . C-reactive protein  . Comprehensive metabolic panel  . CK  . TSH  . POCT urinalysis dipstick  . POCT Microscopic Urinalysis ([facility])  . POCT CBC    Meds ordered this encounter  Medications  . amoxicillin-clavulanate (AUGMENTIN) 875-125 MG tablet    Sig: Take 1 tablet by mouth 2 (two) times daily.    Dispense:  14 tablet    Refill:  0    I personally performed the services described in this  documentation, which was scribed in my presence. The recorded information has been reviewed and considered, and addended by me as needed.   Norberto SorensonEva Humna Moorehouse, M.D.  [facility]  [place] [address] [place], [place] [address] [phone] phone [phone] fax  01/29/2016 12:24 PM  Results for orders placed or performed in visit on 01/23/16  Urine culture  Result Value Ref Range   Colony Count 50,000 COLONIES/ML    Organism ID, Bacteria Multiple bacterial morphotypes present, none    Organism ID, Bacteria predominant. Suggest appropriate recollection if     Organism ID, Bacteria clinically indicated.   Sedimentation Rate  Result Value Ref Range   Sed Rate 11 0 - 20 mm/hr  C-reactive protein  Result Value Ref Range   CRP <0.5 <0.60 mg/dL  Comprehensive metabolic panel  Result Value Ref Range   Sodium 140 135 - 146 mmol/L   Potassium 4.6 3.5 - 5.3 mmol/L   Chloride 104 98 - 110 mmol/L   CO2 27 20 - 31 mmol/L   Glucose, Bld 82 65 - 99 mg/dL   BUN 10 7 - 25 mg/dL   Creat 1.301.02 8.650.50 - 7.841.10 mg/dL   Total Bilirubin 0.7 0.2 - 1.2 mg/dL   Alkaline Phosphatase 51 33 - 115 U/L   AST 16 10 - 30 U/L   ALT 11 6 - 29 U/L   Total Protein 7.8 6.1 - 8.1 g/dL   Albumin 4.6 3.6 - 5.1 g/dL   Calcium 9.1 8.6 - 69.610.2 mg/dL  CK  Result Value Ref Range   Total CK 143 7 - 177 U/L  TSH  Result Value Ref Range   TSH 1.42 mIU/L  POCT urinalysis dipstick  Result Value Ref Range   Color, UA orange (A) yellow   Clarity, UA clear clear   Glucose, UA negative negative   Bilirubin, UA small (A) negative   Ketones, POC UA trace (5) (A) negative   Spec Grav, UA 1.025    Blood, UA large (A) negative   pH, UA 5.5    Protein Ur, POC =30 (A) negative   Urobilinogen, UA 1.0    Nitrite, UA Positive (A) Negative   Leukocytes, UA Negative Negative  POCT Microscopic Urinalysis ([facility])  Result Value Ref Range   WBC,UR,HPF,POC None None WBC/hpf   RBC,UR,HPF,POC Many (A) None RBC/hpf    Bacteria Few (A) None, Too numerous to count   Mucus Present (A) Absent   Epithelial Cells, UR Per Microscopy Few (A) None, Too numerous to count cells/hpf  POCT CBC  Result Value Ref Range   WBC 4.2 (A) 4.6 - 10.2 K/uL   Lymph, poc 2.1 0.6 - 3.4   POC LYMPH PERCENT 50.8 (A) 10 - 50 %L   MID (cbc) 0.3 0 - 0.9   POC MID % 7.4 0 - 12 %M  POC Granulocyte 1.8 (A) 2 - 6.9   Granulocyte percent 41.8 37 - 80 %G   RBC 5.22 4.04 - 5.48 M/uL   Hemoglobin 13.4 12.2 - 16.2 g/dL   HCT, POC 16.140.0 09.637.7 - 47.9 %   MCV 76.7 (A) 80 - 97 fL   MCH, POC 25.8 (A) 27 - 31.2 pg   MCHC 33.6 31.8 - 35.4 g/dL   RDW, POC 04.513.3 %   Platelet Count, POC 216 142 - 424 K/uL   MPV 8.5 0 - 99.8 fL

## 2016-01-24 LAB — URINE CULTURE: Colony Count: 50000

## 2016-01-26 ENCOUNTER — Telehealth: Payer: Self-pay | Admitting: Emergency Medicine

## 2016-01-26 NOTE — Telephone Encounter (Signed)
-----   Message from Sherren MochaEva N Shaw, MD sent at 01/25/2016 10:05 AM EDT ----- Please let pt know that the urine culture appears contaminated so likely not a true bladder infection.  If she is starting to feel better then finish off the antibiotic course. If she is not feeling better, then come back in for recheck and we can decide if we want to stop the antibiotic. Thyroid, inflammation levels, kidneys, liver, salts in the blood, and marker of muscle breakdown are all normal.

## 2016-01-26 NOTE — Telephone Encounter (Signed)
Left message for pt to return message 

## 2016-01-26 NOTE — Telephone Encounter (Signed)
Relayed message

## 2016-08-07 IMAGING — US US OB FOLLOW-UP
2 series · 12 of 28 positions shown · non-contrast
Comparison: none

[Series 1: us ob follow up · 3 of 19 slices shown (1 of 2)]
[im 3/19]
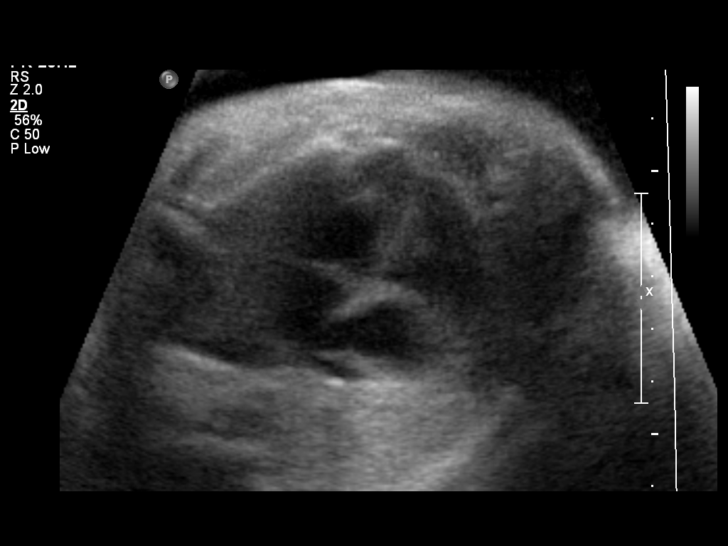
[im 8/19]
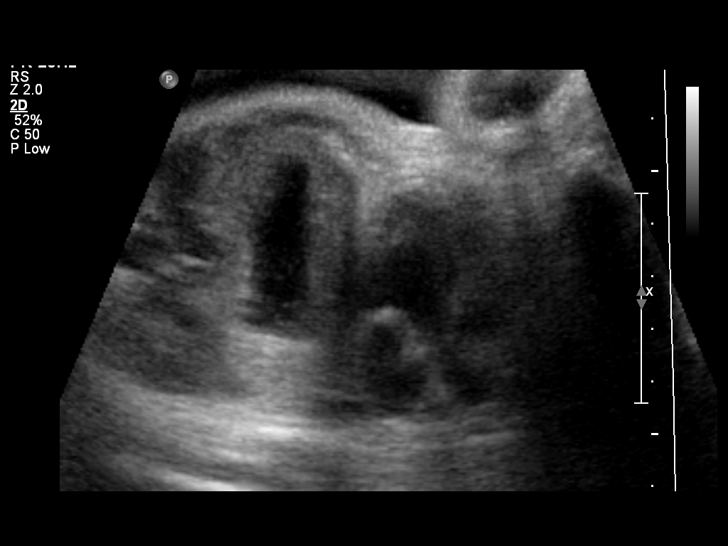
[im 13/19]
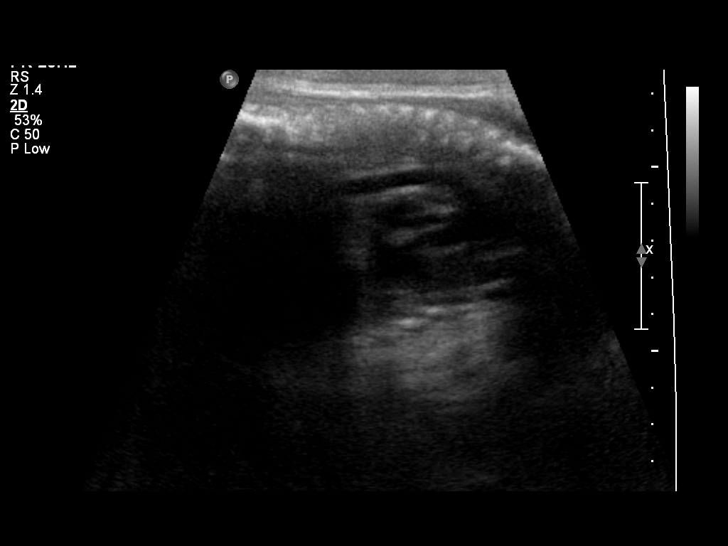

[Series 1: us ob follow up · 9 of 50 slices shown (2 of 2)]
[im 1/50]
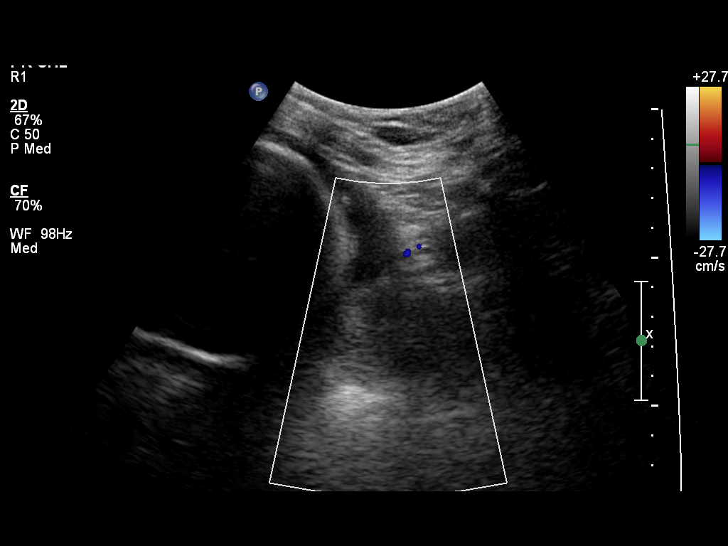
[im 6/50]
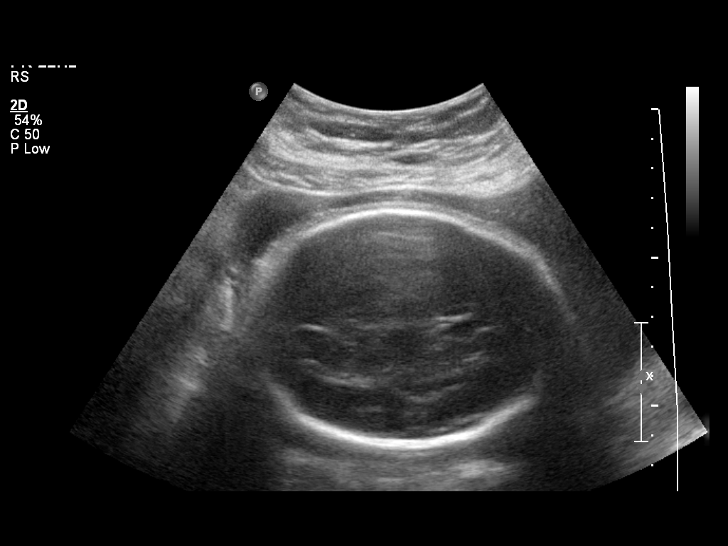
[im 11/50]
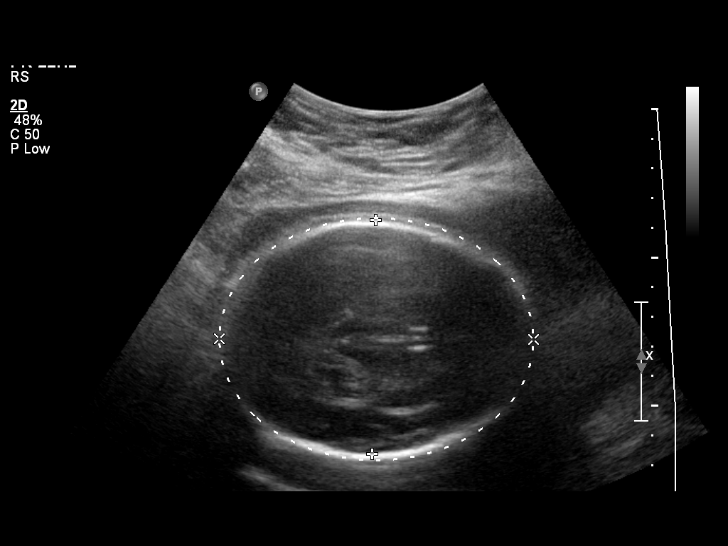
[im 19/50]
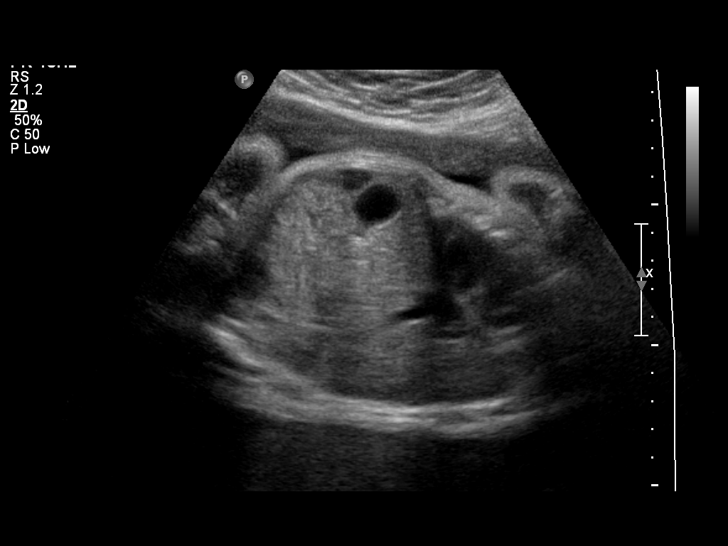
[im 24/50]
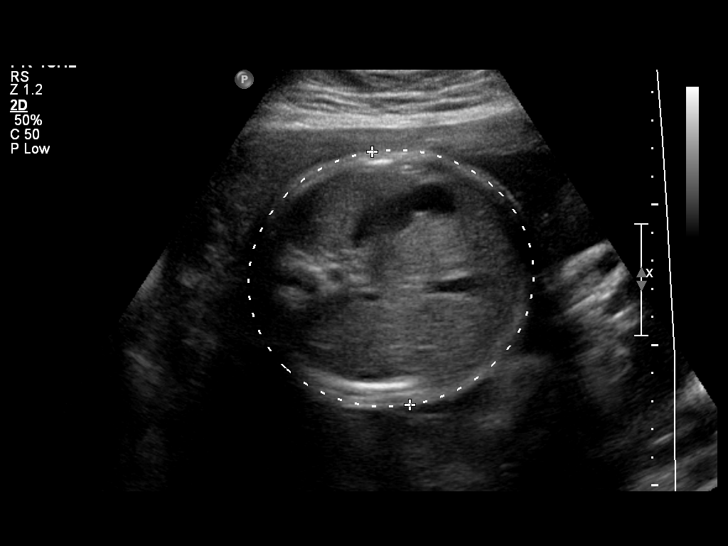
[im 29/50]
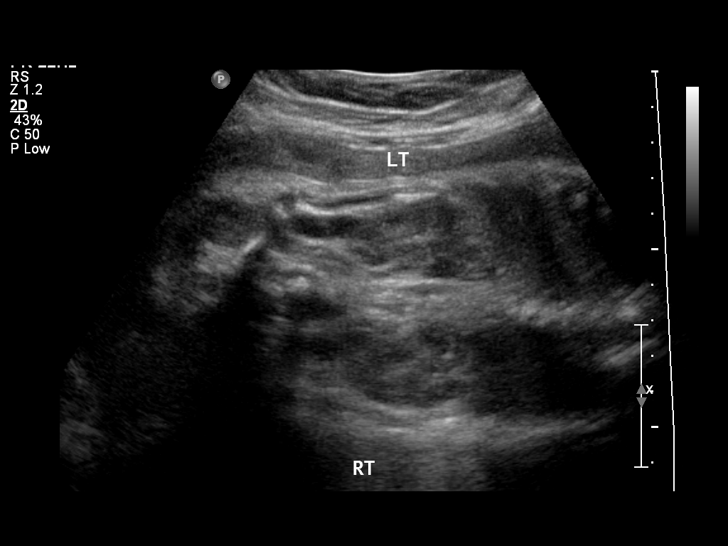
[im 37/50]
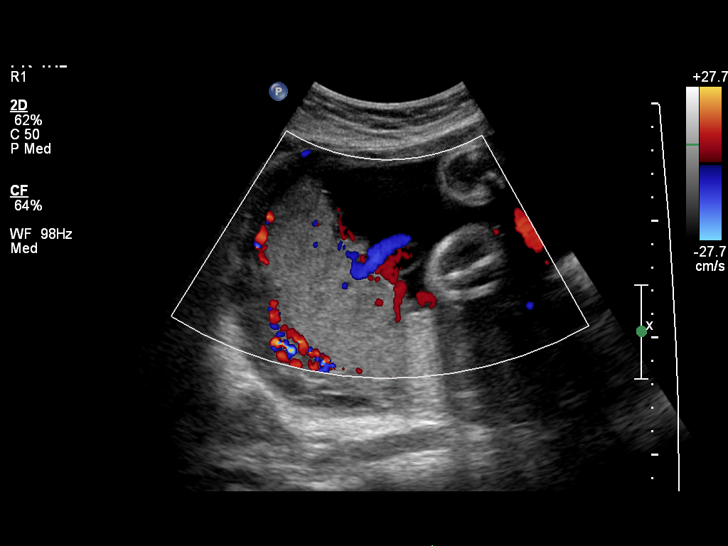
[im 42/50]
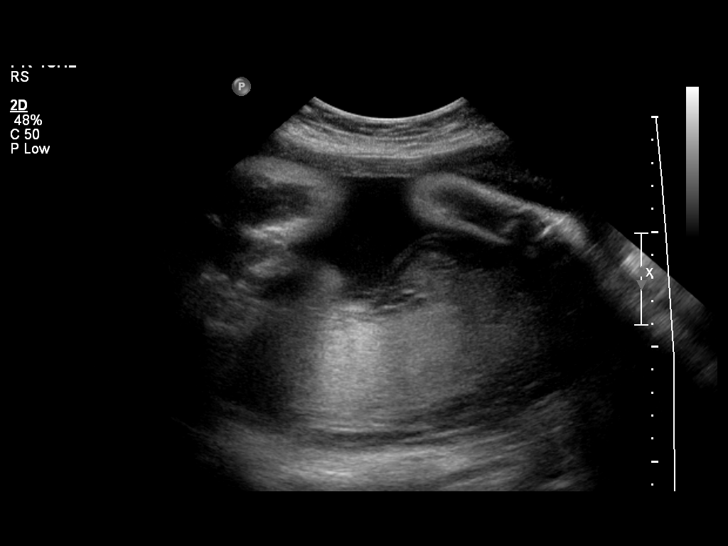
[im 47/50]
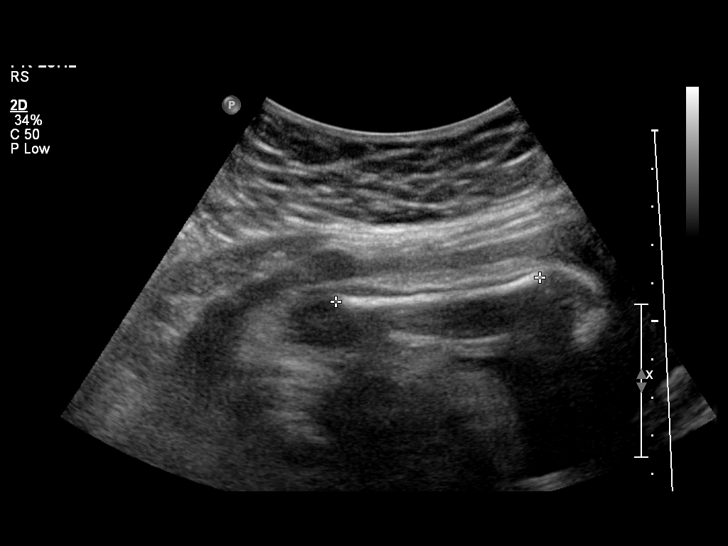

[12 of 28 positions shown; findings below may reference images not displayed]

OBSTETRICS REPORT
                      (Signed Final 09/15/2014 [DATE])

Service(s) Provided

 US OB FOLLOW UP                                       76816.1
Indications

 Maternal morbid obesity(37.1 BMI)
 History of cesarean delivery, currently pregnant
 Gestational diabetes in pregnancy, unspecified
 control
 32 weeks gestation of pregnancy
Fetal Evaluation

 Num Of Fetuses:    1
 Fetal Heart Rate:  148                          bpm
 Cardiac Activity:  Observed
 Presentation:      Cephalic
 Placenta:          Posterior, above cervical
                    os
 P. Cord            Visualized, central
 Insertion:

 Amniotic Fluid
 AFI FV:      Subjectively within normal limits
 AFI Sum:     14.63   cm       51  %Tile     Larg Pckt:    3.84  cm
 RUQ:   3.84    cm   RLQ:    3.84   cm    LUQ:   3.19    cm   LLQ:    3.76   cm
Biometry

 BPD:     77.8  mm     G. Age:  31w 2d                CI:         74.3   70 - 86
 OFD:    104.7  mm                                    FL/HC:      20.8   19.1 -

 HC:     294.1  mm     G. Age:  32w 4d       17  %    HC/AC:      0.97   0.96 -

 AC:     304.2  mm     G. Age:  34w 3d       93  %    FL/BPD:     78.8   71 - 87
 FL:      61.3  mm     G. Age:  31w 6d       23  %    FL/AC:      20.2   20 - 24
 HUM:     53.5  mm     G. Age:  31w 1d       28  %

 Est. FW:    5455  gm    4 lb 11 oz      71  %
Gestational Age

 LMP:           32w 3d        Date:  01/31/14                 EDD:   11/07/14
 U/S Today:     32w 4d                                        EDD:   11/06/14
 Best:          32w 3d     Det. By:  LMP  (01/31/14)          EDD:   11/07/14
Anatomy

 Cranium:          Appears normal         Aortic Arch:      Appears normal
 Fetal Cavum:      Appears normal         Ductal Arch:      Appears normal
 Ventricles:       Appears normal         Diaphragm:        Appears normal
 Choroid Plexus:   Appears normal         Stomach:          Appears normal, left
                                                            sided
 Cerebellum:       Appears normal         Abdomen:          Previously seen
 Posterior Fossa:  Not well visualized    Abdominal Wall:   Appears nml (cord
                                                            insert, abd wall)
 Nuchal Fold:      Not well visualized    Cord Vessels:     Previously seen
 Face:             Orbits nl prev;        Kidneys:          Appear normal
                   profile not well
                   visualized
 Lips:             Previously seen        Bladder:          Appears normal
 Heart:            Appears normal         Spine:            Previously seen
                   (4CH, axis, and
                   situs)
 RVOT:             Appears normal         Lower             Previously seen
                                          Extremities:
 LVOT:             Appears normal         Upper             Previously seen
                                          Extremities:

 Other:  Fetus appears to be a female. Heels visualized. Nasal bone
         visualized. Technically difficult due to maternal habitus and fetal
         position.
Cervix Uterus Adnexa

 Cervix:       Normal appearance by transabdominal scan.
 Uterus:       No abnormality visualized.
 Cul De Sac:   No free fluid seen.

 Left Ovary:    No adnexal mass visualized.
 Right Ovary:   No adnexal mass visualized.
 Adnexa:     No abnormality visualized.
Impression

 Single IUP at 32w 3d
 Normal interval anatomy
 Limited views of the posterior fossa obtained due to fetal
 position
 The estimated fetal weight today is at the 71st %tile.
 Posterior placenta without previa
 Normal amniotic fluid volume
Recommendations

 Follow-up ultrasounds as clinically indicated.

 questions or concerns.

## 2024-02-21 ENCOUNTER — Other Ambulatory Visit: Payer: Self-pay

## 2024-02-21 DIAGNOSIS — S61412A Laceration without foreign body of left hand, initial encounter: Secondary | ICD-10-CM | POA: Diagnosis not present

## 2024-02-21 DIAGNOSIS — W25XXXA Contact with sharp glass, initial encounter: Secondary | ICD-10-CM | POA: Diagnosis not present

## 2024-02-21 DIAGNOSIS — Z23 Encounter for immunization: Secondary | ICD-10-CM | POA: Diagnosis not present

## 2024-02-21 DIAGNOSIS — S99922A Unspecified injury of left foot, initial encounter: Secondary | ICD-10-CM | POA: Diagnosis present

## 2024-02-21 DIAGNOSIS — S91312A Laceration without foreign body, left foot, initial encounter: Secondary | ICD-10-CM | POA: Diagnosis not present
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
## 2024-02-22 ENCOUNTER — Encounter (HOSPITAL_BASED_OUTPATIENT_CLINIC_OR_DEPARTMENT_OTHER): Payer: Self-pay

## 2024-02-22 ENCOUNTER — Emergency Department (HOSPITAL_BASED_OUTPATIENT_CLINIC_OR_DEPARTMENT_OTHER)
Admission: EM | Admit: 2024-02-22 | Discharge: 2024-02-22 | Disposition: A | Discharge status: Home / Self Care | Payer: Self-pay | Attending: Emergency Medicine | Admitting: Emergency Medicine

## 2024-02-22 ENCOUNTER — Emergency Department (HOSPITAL_BASED_OUTPATIENT_CLINIC_OR_DEPARTMENT_OTHER): Payer: Self-pay

## 2024-02-22 DIAGNOSIS — S91312A Laceration without foreign body, left foot, initial encounter: Secondary | ICD-10-CM

## 2024-02-22 DIAGNOSIS — S61419A Laceration without foreign body of unspecified hand, initial encounter: Secondary | ICD-10-CM

## 2024-02-22 MED ORDER — TETANUS-DIPHTH-ACELL PERTUSSIS 5-2.5-18.5 LF-MCG/0.5 IM SUSY
0.5000 mL | PREFILLED_SYRINGE | Freq: Once | INTRAMUSCULAR | Status: AC
  Administered 2024-02-22: 0.5 mL via INTRAMUSCULAR
  Filled 2024-02-22: qty 0.5

## 2024-02-22 MED ORDER — BACITRACIN ZINC 500 UNIT/GM EX OINT
TOPICAL_OINTMENT | Freq: Two times a day (BID) | CUTANEOUS | Status: DC
  Filled 2024-02-22: qty 28.35

## 2024-02-22 NOTE — Discharge Instructions (Addendum)
 Recommend soaking wounds in warm water.  Can dress with bacitracin  antibiotic ointment.  Please follow-up with your primary care provider if pain persists.
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
